# Patient Record
Sex: Female | Born: 1955
Health system: Southern US, Community
[De-identification: ages and names within clinical notes are randomized; demographics above are authoritative.]

## PROBLEM LIST (undated history)

## (undated) DIAGNOSIS — G4730 Sleep apnea, unspecified: Secondary | ICD-10-CM

## (undated) DIAGNOSIS — E039 Hypothyroidism, unspecified: Secondary | ICD-10-CM

## (undated) DIAGNOSIS — L409 Psoriasis, unspecified: Secondary | ICD-10-CM

## (undated) DIAGNOSIS — I10 Essential (primary) hypertension: Secondary | ICD-10-CM

## (undated) DIAGNOSIS — M1990 Unspecified osteoarthritis, unspecified site: Secondary | ICD-10-CM

## (undated) DIAGNOSIS — F32A Depression, unspecified: Secondary | ICD-10-CM

## (undated) HISTORY — PX: DILATION AND CURETTAGE OF UTERUS: SHX78

## (undated) HISTORY — PX: ABDOMINAL HYSTERECTOMY: SHX81

## (undated) HISTORY — DX: Psoriasis, unspecified: L40.9

## (undated) HISTORY — PX: TONSILLECTOMY: SUR1361

## (undated) HISTORY — PX: TUBAL LIGATION: SHX77

## (undated) HISTORY — PX: BELPHAROPTOSIS REPAIR: SHX369

## (undated) HISTORY — PX: ENDOMETRIAL ABLATION: SHX621

---

## 1998-06-24 ENCOUNTER — Other Ambulatory Visit: Admission: RE | Admit: 1998-06-24 | Discharge: 1998-06-24 | Payer: Self-pay | Admitting: Gynecology

## 1999-07-29 ENCOUNTER — Other Ambulatory Visit: Admission: RE | Admit: 1999-07-29 | Discharge: 1999-07-29 | Payer: Self-pay | Admitting: Internal Medicine

## 1999-08-04 ENCOUNTER — Other Ambulatory Visit: Admission: RE | Admit: 1999-08-04 | Discharge: 1999-08-04 | Payer: Self-pay | Admitting: Internal Medicine

## 1999-08-04 ENCOUNTER — Encounter (INDEPENDENT_AMBULATORY_CARE_PROVIDER_SITE_OTHER): Payer: Self-pay

## 1999-09-25 ENCOUNTER — Encounter (INDEPENDENT_AMBULATORY_CARE_PROVIDER_SITE_OTHER): Payer: Self-pay

## 1999-09-25 ENCOUNTER — Ambulatory Visit (HOSPITAL_COMMUNITY): Admission: RE | Admit: 1999-09-25 | Discharge: 1999-09-25 | Payer: Self-pay | Admitting: Gynecology

## 2000-08-25 ENCOUNTER — Other Ambulatory Visit: Admission: RE | Admit: 2000-08-25 | Discharge: 2000-08-25 | Payer: Self-pay | Admitting: Gynecology

## 2000-11-10 ENCOUNTER — Encounter: Payer: Self-pay | Admitting: Family Medicine

## 2000-11-10 ENCOUNTER — Emergency Department (HOSPITAL_COMMUNITY): Admission: RE | Admit: 2000-11-10 | Discharge: 2000-11-10 | Payer: Self-pay | Admitting: Family Medicine

## 2000-11-12 ENCOUNTER — Encounter (INDEPENDENT_AMBULATORY_CARE_PROVIDER_SITE_OTHER): Payer: Self-pay

## 2000-11-12 ENCOUNTER — Inpatient Hospital Stay (HOSPITAL_COMMUNITY): Admission: RE | Admit: 2000-11-12 | Discharge: 2000-11-15 | Payer: Self-pay | Admitting: Gynecology

## 2001-12-22 ENCOUNTER — Other Ambulatory Visit: Admission: RE | Admit: 2001-12-22 | Discharge: 2001-12-22 | Payer: Self-pay | Admitting: Gynecology

## 2003-02-27 ENCOUNTER — Other Ambulatory Visit: Admission: RE | Admit: 2003-02-27 | Discharge: 2003-02-27 | Payer: Self-pay | Admitting: Gynecology

## 2004-02-29 ENCOUNTER — Other Ambulatory Visit: Admission: RE | Admit: 2004-02-29 | Discharge: 2004-02-29 | Payer: Self-pay | Admitting: Gynecology

## 2005-03-02 ENCOUNTER — Other Ambulatory Visit: Admission: RE | Admit: 2005-03-02 | Discharge: 2005-03-02 | Payer: Self-pay | Admitting: Gynecology

## 2006-05-27 ENCOUNTER — Other Ambulatory Visit: Admission: RE | Admit: 2006-05-27 | Discharge: 2006-05-27 | Payer: Self-pay | Admitting: Gynecology

## 2007-08-22 ENCOUNTER — Other Ambulatory Visit: Admission: RE | Admit: 2007-08-22 | Discharge: 2007-08-22 | Payer: Self-pay | Admitting: Gynecology

## 2008-09-07 ENCOUNTER — Other Ambulatory Visit: Admission: RE | Admit: 2008-09-07 | Discharge: 2008-09-07 | Payer: Self-pay | Admitting: Gynecology

## 2008-09-07 ENCOUNTER — Encounter: Payer: Self-pay | Admitting: Women's Health

## 2008-09-07 ENCOUNTER — Ambulatory Visit: Payer: Self-pay | Admitting: Women's Health

## 2009-05-27 ENCOUNTER — Encounter: Admission: RE | Admit: 2009-05-27 | Discharge: 2009-05-27 | Payer: Self-pay | Admitting: Family Medicine

## 2010-06-27 NOTE — Discharge Summary (Signed)
[facility]  Patient:    TILIA, FASO Visit Number: [id number] MRN: [id number]          Service Type: GYN Location: [id number] Attending Physician:  Douglass Rivers Dictated by:   Antony Contras, [facility] Admit Date:  11/12/2000 Discharge Date: 11/15/2000                             Discharge Summary  DISCHARGE DIAGNOSES:          1. Hematometria.                               2. Hematosalpinx.                               3. Secondary pelvic pain.  PROCEDURES:                   Total abdominal hysterectomy.  HISTORY OF PRESENT ILLNESS:   The patient is a 55 year old, gravida 3, para 2, status post endometrial ablation August 2001 who essentially did very well having only one spontaneous period in October of 2001. The patient was also felt to be postmenopausal based on an [facility] and was started on hormone replacement in August in the form of Prempro 2.5 mg. She later developed severe abdominal pain and was treated for diverticulitis by her family physician. She was then later seen by a general surgeon and had a CT of the abdomen and pelvis. The CT of the abdomen was unremarkable. The CT of the pelvis showed low attenuation lesions of the uterus, questionable septated uterus with fluid in the canal consistent with a cornual collection of blood, bilateral hydrosalpinx or hematosalpinx with small amount of free fluid. She was given pain medication and was asked to follow up in the GYN office. She has a history in the past of two cesarean sections and bilateral tubal ligation. She was offered attempted dilation of the lower uterine segment with risk of re-scarring and perforation, or definitive surgery and she decided to proceed with definitive surgery.  HOSPITAL COURSE/TREATMENT:    The patient was admitted on November 12, 2000. Total abdominal hysterectomy was performed by Dr. Farrel Gobble under general anesthesia. Findings included a boggy fundus with  evidence of extravation of blood in the fundal cornual regions as well as the tubes, normal ovaries and cervix.  POSTOPERATIVE COURSE:         The patient remained afebrile, had no difficulty voiding, was able to be discharged in satisfactory condition on her third postoperative day. Postoperative CBC: Hematocrit was 35.1, hemoglobin 12, WBCs 12.4, and platelets 238,000.  DISPOSITION:                  The patient is to follow up in the office in two weeks.  DISCHARGE MEDICATIONS:        Tylox and over-the-counter Motrin for pain.Dictated by:   Antony Contras, [facility] Attending Physician:  Douglass Rivers DD:  12/03/00 TD:  12/06/00 Job: [id number]

## 2010-06-27 NOTE — H&P (Signed)
[facility]  Patient:    Cassidy Terry, Cassidy Terry Visit Number: [id number] MRN: [id number]          Service Type: EMS Location: ED Attending Physician:  Shelba Flake Dictated by:   Douglass Rivers, M.D. Admit Date:  11/10/2000 Discharge Date: 11/10/2000                           History and Physical  CHIEF COMPLAINT:              Pelvic pain with hematometra.  HISTORY OF PRESENT ILLNESS:   The patient is a 55 year old G3, P2 status post an endomyometrial ablation in August 2001 who essentially did very well having only one spontaneous period in October 2001.  The patient was felt to be postmenopausal based on an [facility] and was started on hormone replacement in August 2002 in the form of Prempro 2.5.  She essentially was complaining of tiredness and fatigue, but no hot flashes or vaginal dryness.  The patient had done well until the weekend when she started developing severe abdominal pain. On Monday she was seen by her family practice physician and was felt to have diverticulitis and was treated with antibiotics.  She was seen back in the office later on in the week continuing to have pain and was then sent to [facility] ER to be seen by a Development worker, international aid.  She had a CT of the abdomen and pelvis which was done.  The CT of the abdomen was unremarkable.  The CT of the pelvis showed low attenuation lesions in the uterus, questionable septated uterus with fluid in the canal consistent with cornual collections of blood, bilateral hydrosalpinx or hematosalpinx with a small amount of free fluid. The patient was given pain medicine in the ER and was then asked to follow-up in our office.  She had an ultrasound which confirmed the presence of fluid in the uterine fundal region and questionably polyps as well versus clot.  The adnexa were negative.  PAST OBSTETRICAL HISTORY:     Significant for two cesarean sections, history of bilateral tubal ligation.  PAST  GYNECOLOGICAL HISTORY:   Significant for previously regular period prior to the procedure.  PAST MEDICAL HISTORY:         Negative.  MEDICATIONS:                  Percocet for pain and some undisclosed antibiotic that she has not taken.  PAST SURGICAL HISTORY:        Significant for the endomyometrial ablation in 2001, tubal ligation in 1993.  PHYSICAL EXAMINATION  GENERAL:                      She is an ill appearing female in moderate distress.  She is unable to sit upright.  HEART:                        Regular rate.  LUNGS:                        Clear to auscultation.  ABDOMEN:                      There is some guarding, normal bowel sounds, and some tenderness in the infraumbilical region.  PELVIC:  Bimanual examination:  No cervical motion tenderness.  The anterior fundal portion of the uterus is exquisitely tender. Rectovaginal examination:  There is no tenderness of the lower uterine segment.  ASSESSMENT:                   Hematometra with history of previous endomyometrial ablation.  The patient was offered attempted dilation of the lower uterine segment with risks of rescarring and perforation which were discussed versus going ahead with definitive surgery.  At this point she and her husband would like to just proceed with definitive surgery.  Will therefore have her come in for a hysterectomy in the morning.  Risks and benefits of the procedure were discussed with the patient.  All questions were addressed. Dictated by:   Douglass Rivers, M.D. Attending Physician:  Shelba Flake DD:  11/11/00 TD:  11/11/00 Job: [phone] [id number]

## 2010-06-27 NOTE — H&P (Signed)
[facility]  Patient:    Cassidy Terry, Cassidy Terry Visit Number: [id number] MRN: [id number]          Service Type: EMS Location: ED Attending Physician:  Cassidy Terry Dictated by:   Cassidy Terry, M.D. Admit Date:  11/10/2000 Discharge Date: 11/10/2000   CC:         Cassidy Terry, M.D.  Cassidy Terry, M.D.   History and Physical  ACCOUNT NUMBER:  [id number]  REASON FOR VISIT:  Abdominal pain.  HISTORY OF PRESENT ILLNESS:  [name] is a 55 year old woman who began having some lower abdominal pain approximately a week ago.  She describes its origin as being lower midline, seemingly radiating down into the groin area.  It got worse on Saturday and then has eased off a little bit, but has then been gradually getting worse again.  She originally saw Cassidy Terry who thought she might have an early diverticulitis and placed her on some antibiotics, I believe Bactrim and Flagyl.  This did not seem to help and, in fact, the patient has continued to get worse on the antibiotics.  She has not had any fever or chills at home.  She has not had any urinary tract symptoms.  She does feel like she is constipated and has not had any diarrhea.  She has never had any similar pain to this.  The patient currently describes her pain as feeling like contractions as if she were trying to have a baby.  PAST MEDICAL HISTORY:  Of note she had an endometrial ablation by Cassidy Terry approximately a year ago.  She started on hormone replacement approximately one month ago.  Other operations include two C-sections.  CURRENT MEDICATIONS:  Hypertension medication.  ALLERGIES:  No known drug allergies.  HABITS:  Smokes none.  FAMILY HISTORY:  Unremarkable.  REVIEW OF SYSTEMS:  HEENT negative.  CHEST:  No cough or shortness of breath. HEART:  History of hypertension, otherwise negative.  GU:  Negative.  PHYSICAL EXAMINATION:  VITAL SIGNS:  Blood  pressure 132/45, pulse 85, respirations 20, and temperature 99.  GENERAL:  The patient is a generally healthy, but uncomfortable appearing 55 year old.  She is alert, awake.  HEENT:  Head is normocephalic.  Eyes nonicteric.  Pupils round and regular.  NECK:  Supple without masses or thyromegaly.  LUNGS:  Clear to auscultation.  HEART:  Regular without murmur, rub or gallop.  ABDOMEN:  Soft, somewhat tender diffusely, but particularly in lower midline. The lower adnexal areas are tender also.  Did not repeat pelvic exam; this was seen here.  She got some Stadol prior to coming over here.  LABORATORY:  White count at Cassidy Terry office was 11,000.  X-RAY:  CT was done here and on review there does not appear to be any evidence of acute inflammatory process within the peritoneal cavity.  There is no appendicitis, diverticulitis, dilated bowel loops, bowel obstruction, etc. However, she does appear to have some fluid in her uterus, perhaps some fluid in the cornu of the uterus and possibly consistent with some problem with some residual endometrium following her ablation.  IMPRESSION:  Pelvic pain, most likely GYN in origin.  PLAN:  I discussed the situation with Cassidy Terry, M.D. who felt that her symptoms might be consistent with some residual retained endometrium issue.  He agreed to see her either this evening or tomorrow in the office. Since there is nothing that we will do tonight for her therapeutically other  than some pain medications, I discussed the situation with the patient, and she wished to go home and followup with Cassidy Terry at his office early tomorrow morning.  She was given some morphine here for pain as well as some Percocet tablets to take home.  She will see Cassidy Terry tomorrow morning. Dictated by:   Cassidy Terry, M.D. Attending Physician:  Cassidy Terry DD:  11/10/00 TD:  11/11/00 Job: [id number]

## 2010-06-27 NOTE — Op Note (Signed)
[facility]  Patient:    Cassidy Terry, Cassidy Terry                       MRN: [id number] Proc. Date: 09/25/99 Adm. Date:  [id number] Attending:  Tonye Royalty                           Operative Report  PREOPERATIVE DIAGNOSES:       1. Refractory menometrorrhagia.                               2. Questionable endometrial polyp.  POSTOPERATIVE DIAGNOSIS:      Menometrorrhagia.  OPERATION:                    1. _______ resection.                               2. Endometrial ablation.  SURGEON:                      Juan H. Lily Peer, M.D.  ASSISTANT:  ANESTHESIA:                   General endotracheal anesthesia.  ESTIMATED BLOOD LOSS:         Minimal.  INDICATIONS:                  A 55 year old gravida 3, para 2, AB 1 with history of menometrorrhagia unresponsive to hormonal manipulation, also with previous sterilization procedure.  Evaluation consisted of sonohistogram and endometrial biopsy.  Questionable endometrial polyps. Endometrial biopsy had been benign.  FINDINGS:                     A normal endocervical canal, normal uterine cavity, fleshy-like area perhaps in the fundal region of _______________ cavity. Both tubal ostia were identified and were clear.  The patient had received Lupron 3.75 mg IM approximately 30 days prior to her surgery.  DESCRIPTION OF PROCEDURE:     After the patient was adequately counseled she was taken to the operating room where she underwent a successful general endotracheal anesthesia.  She was placed in the low lithotomy position. Examination under anesthesia demonstrated normal size uterus, anteverted, normal size, shape and consistency.  Adnexa without masses or tenderness.  Of note, the patient had a laminaria that was placed the day before in the office to facilitate insertion of the operative hysteroscope. At the time of surgery this was removed.  The vagina and perineum were prepped and draped in  the usual sterile fashion.  A red rubber Roxan Hockey was utilized to evacuate the bladder of its contents for approximately 75 cc.  After this, the anterior cervical lip was grasped with a single-tooth tenaculum.  A Sims retractor was used and a weighted speculum for exposure.  The [facility] operative resectoscope with a 90 degree wire loop was inserted into the cervical canal and into the uterine cavity.  The distending media was chilled 3% sorbitol solution. Thorough inspection of the endometrial cavity did not demonstrate a true polyp per se.  Perhaps what was seen at sonohistogram may have been sloughing off endometrium or blood clot due to patients history of chronic menometrorrhagia. After the systematic inspection of the intrauterine cavity, with a 90 degree  wire loop the endometrium was incised to the level of the myometrium in a circumferential fashion and this tissues ____________ were submitted for histological evaluation.  Once this was completed, the 90 degree wire loop was switched to the roller ball.  Of note, the [facility] generator was utilized with the 90 degree wire loop; 80 watts was used on the cutting mode and 80 on the coagulation mode. After the roller ball was attached to the operating histoscope, the intrauterine cavity was ablated to a depth of approximately 2 or 3 mm in a circumferential fashion.  Pre- and postoperative pictures were obtained and a copy will be kept at [facility] while a second set will be kept in the patients office record at [facility].  The patient tolerated the procedure well.  She did receive a gram of Cefotan prophylactically.  The single-tooth tenaculum was removed and there was no bleeding noted.  The patient was extubated and transferred to the recovery room with stable vital signs.  FLUID RESUSCITATION:          700 cc of lactated Ringers.  Fluid deficit from the 3% sorbitol was registered at 100 cc. DD:   09/25/99 TD:  09/26/99 Job: [id number]

## 2010-10-11 HISTORY — PX: FOOT SURGERY: SHX648

## 2010-10-24 ENCOUNTER — Encounter: Payer: Self-pay | Admitting: Women's Health

## 2010-11-06 ENCOUNTER — Ambulatory Visit (INDEPENDENT_AMBULATORY_CARE_PROVIDER_SITE_OTHER): Payer: 59 | Admitting: Women's Health

## 2010-11-06 ENCOUNTER — Other Ambulatory Visit (HOSPITAL_COMMUNITY)
Admission: RE | Admit: 2010-11-06 | Discharge: 2010-11-06 | Disposition: A | Discharge status: Home / Self Care | Payer: 59 | Source: Ambulatory Visit | Attending: Women's Health | Admitting: Women's Health

## 2010-11-06 ENCOUNTER — Encounter: Payer: Self-pay | Admitting: Women's Health

## 2010-11-06 VITALS — BP 140/80 | Ht 68.0 in | Wt 163.0 lb

## 2010-11-06 DIAGNOSIS — Z01419 Encounter for gynecological examination (general) (routine) without abnormal findings: Secondary | ICD-10-CM

## 2010-11-06 NOTE — Progress Notes (Signed)
Cassidy Terry [birth year] [id number]    History:    The patient presents for annual exam.  Works in [employer] and recent right foot surgery.   Past medical history, past surgical history, family history and social history were all reviewed and documented in the EPIC chart.   ROS:  A  ROS was performed and pertinent positives and negatives are included in the history.  Exam:  Filed Vitals:   11/06/10 1132  BP: 140/80    General appearance:  Normal Head/Neck:  Normal, without cervical or supraclavicular adenopathy. Thyroid:  Symmetrical, normal in size, without palpable masses or nodularity. Respiratory  Effort:  Normal  Auscultation:  Clear without wheezing or rhonchi Cardiovascular  Auscultation:  Regular rate, without rubs, murmurs or gallops  Edema/varicosities:  Not grossly evident Abdominal  Soft,nontender, without masses, guarding or rebound.  Liver/spleen:  No organomegaly noted  Hernia:  None appreciated  Skin  Inspection:  Grossly normal  Palpation:  Grossly normal Neurologic/psychiatric  Orientation:  Normal with appropriate conversation.  Mood/affect:  Normal  Genitourinary    Breasts: Examined lying and sitting.     Right: Without masses, retractions, discharge or axillary adenopathy.     Left: Without masses, retractions, discharge or axillary adenopathy.   Inguinal/mons:  Normal without inguinal adenopathy  External genitalia:  Normal  BUS/Urethra/Skene's glands:  Normal  Bladder:  Normal  Vagina:  Normal  Cervix:  absent  Uterus:  absent  Adnexa/parametria:     Rt: Without masses or tenderness.   Lt: Without masses or tenderness.  Anus and perineum: Normal  Digital rectal exam: Normal sphincter tone without palpated masses or tenderness  Assessment/Plan:  55 y.o. MWF G$P2 for annual exam. History of a TAH for hematometria on no ERT. She had a negative colonoscopy in 08, she states she has had a DEXA in the past at [facility] not sure when will repeat  this year. Prescription given, will schedule. Had recent for surgery making exercise difficult but will gradually increase. SBEs, annual mammogram which have been normal, calcium rich diet encourage, vitamin D 1000 daily encouraged, fish oil supplement, Pap only today. Labs are at her primary care. Flu vaccine encouraged.[name]    Harrington Challenger WHNP, 12:09 PM 11/06/2010

## 2010-12-18 ENCOUNTER — Encounter: Payer: Self-pay | Admitting: Women's Health

## 2011-08-19 ENCOUNTER — Ambulatory Visit
Admission: RE | Admit: 2011-08-19 | Discharge: 2011-08-19 | Disposition: A | Discharge status: Home / Self Care | Payer: 59 | Source: Ambulatory Visit | Attending: Family Medicine | Admitting: Family Medicine

## 2011-08-19 ENCOUNTER — Other Ambulatory Visit: Payer: Self-pay | Admitting: Family Medicine

## 2011-08-19 DIAGNOSIS — R079 Chest pain, unspecified: Secondary | ICD-10-CM

## 2011-12-03 ENCOUNTER — Telehealth: Payer: Self-pay | Admitting: Women's Health

## 2011-12-03 NOTE — Telephone Encounter (Signed)
Telephone call to review labs that were faxed. Has followup with primary care today for evaluation of elevated cholesterol. Had been on cholesterol medication and has stopped. For a repeat LP today and has annual exam here scheduled in  November.

## 2011-12-17 ENCOUNTER — Encounter: Payer: Self-pay | Admitting: Women's Health

## 2011-12-24 ENCOUNTER — Encounter: Payer: Self-pay | Admitting: Women's Health

## 2011-12-24 ENCOUNTER — Ambulatory Visit (INDEPENDENT_AMBULATORY_CARE_PROVIDER_SITE_OTHER): Payer: 59 | Admitting: Women's Health

## 2011-12-24 VITALS — BP 108/70 | Ht 67.5 in | Wt 166.0 lb

## 2011-12-24 DIAGNOSIS — Z01419 Encounter for gynecological examination (general) (routine) without abnormal findings: Secondary | ICD-10-CM

## 2011-12-24 NOTE — Progress Notes (Signed)
Cassidy Terry 1955-06-27 [id number]    History:    The patient presents for annual exam.  Postmenopausal no ERT/ history of TAH for hematometria 2002. History of normal Paps and mammograms. History of elevated cholesterol had been on cholesterol medication/normal lipid panel at primary care. Normal colonoscopy in 2008, negative Hemoccult at primary care. DEXA normal 2012, T score -0.8, FRAX 5.6%.   Past medical history, past surgical history, family history and social history were all reviewed and documented in the EPIC chart. 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 Amber living in [place], husband Hotel manager, son lives here.   ROS:  A  ROS was performed and pertinent positives and negatives are included in the history.  Exam:  Filed Vitals:   12/24/11 0845  BP: 108/70    General appearance:  Normal Head/Neck:  Normal, without cervical or supraclavicular adenopathy. Thyroid:  Symmetrical, normal in size, without palpable masses or nodularity. Respiratory  Effort:  Normal  Auscultation:  Clear without wheezing or rhonchi Cardiovascular  Auscultation:  Regular rate, without rubs, murmurs or gallops  Edema/varicosities:  Not grossly evident Abdominal  Soft,nontender, without masses, guarding or rebound.  Liver/spleen:  No organomegaly noted  Hernia:  None appreciated  Skin  Inspection:  Grossly normal  Palpation:  Grossly normal Neurologic/psychiatric  Orientation:  Normal with appropriate conversation.  Mood/affect:  Normal  Genitourinary    Breasts: Examined lying and sitting.     Right: Without masses, retractions, discharge or axillary adenopathy.     Left: Without masses, retractions, discharge or axillary adenopathy.   Inguinal/mons:  Normal without inguinal adenopathy  External genitalia:  Normal  BUS/Urethra/Skene's glands:  Normal  Bladder:  Normal  Vagina:  Normal  Cervix:  Absent  Uterus:  Absent  Adnexa/parametria:     Rt: Without masses or tenderness.   Lt: Without  masses or tenderness.  Anus and perineum: Normal  Digital rectal exam: Normal sphincter tone without palpated masses or tenderness  Assessment/Plan:  56 y.o. M. WF G3 P2 for annual exam with occasional urinary frequency.    Urinary frequency Postmenopausal/TAH/ no ERT History of hypercholesteremia currently on no medication Normal DEXA 2012  Plan: Continue labs at primary care. UA, new screening Pap guidelines reviewed normal Pap 2012. SBE's, continue annual mammogram, calcium rich diet, vitamin D 2000 daily encouraged, reviewed importance of increasing regular exercise for bone and general health.    Harrington Challenger [facility], 9:39 AM 12/24/2011

## 2011-12-24 NOTE — Patient Instructions (Addendum)
Vit D 2000 daily Health Recommendations for Postmenopausal Women Based on the Results of the Women's Health Initiative (WHI) and Other Studies The WHI is a major 15-year research program to address the most common causes of death, disability and poor quality of life in postmenopausal women. Some of these causes are heart disease, cancer, bone loss (osteoporosis) and others. Taking into account all of the findings from WHI and other studies, here are bottom-line health recommendations for women: CARDIOVASCULAR DISEASE Heart Disease: A heart attack is a medical emergency. Know the signs and symptoms of a heart attack. Hormone therapy should not be used to prevent heart disease. In women with heart disease, hormone therapy should not be used to prevent further disease. Hormone therapy increases the risk of blood clots. Below are things women can do to reduce their risk for heart disease.   Do not smoke. If you smoke, quit. Women who smoke are 2 to 6 times more likely to suffer a heart attack than non-smoking women.  Aim for a healthy weight. Being overweight causes many preventable deaths. Eat a healthy and balanced diet and drink an adequate amount of liquids.  Get moving. Make a commitment to be more physically active. Aim for 30 minutes of activity on most, if not all days of the week.  Eat for heart health. Choose a diet that is low in saturated fat, trans fat, and cholesterol. Include whole grains, vegetables, and fruits. Read the labels on the food container before buying it.  Know your numbers. Ask your caregiver to check your blood pressure, cholesterol (total, HDL, LDL, triglycerides) and blood glucose. Work with your caregiver to improve any numbers that are not normal.  High blood pressure. Limit or stop your table salt intake (try salt substitute and food seasonings), avoid salty foods and drinks. Read the labels on the food container before buying it. Avoid becoming overweight by eating  well and exercising. STROKE  Stroke is a medical emergency. Stroke can be the result of a blood clot in the blood vessel in the brain or by a brain hemorrhage (bleeding). Know the signs and symptoms of a stroke. To lower the risk of developing a stroke:  Avoid fatty foods.  Quit smoking.  Control your diabetes, blood pressure, and irregular heart rate. THROMBOPHLIBITIS (BLOOD CLOT) OF THE LEG  Hormone treatment is a big cause of developing blood clots in the leg. Becoming overweight and leading a stationary lifestyle also may contribute to developing blood clots. Controlling your diet and exercising will help lower the risk of developing blood clots. CANCER SCREENING  Breast Cancer: Women should take steps to reduce their risk of breast cancer. This includes having regular mammograms, monthly self breast exams and regular breast exams by your caregiver. Have a mammogram every one to two years if you are 40 to 56 years old. Have a mammogram annually if you are 50 years old or older depending on your risk factors. Women who are high risk for breast cancer may need more frequent mammograms. There are tests available (testing the genes in your body) if you have family history of breast cancer called BRCA 1 and 2. These tests can help determine the risks of developing breast cancer.  Intestinal or Stomach Cancer: Women should talk to their caregiver about when to start screening, what tests and how often they should be done, and the benefits and risks of doing these tests. Tests to consider are a rectal exam, fecal occult blood, sigmoidoscopy, colononoscoby, barium enema   and upper GI series of the stomach. Depending on the age, you may want to get a medical and family history of colon cancer. Women who are high risk may need to be screened at an earlier age and more often.  Cervical Cancer: A Pap test of the cervix should be done every year and every 3 years when there has been three straight years of a  normal Pap test. Women with an abnormal Pap test should be screened more often or have a cervical biopsy depending on your caregiver's recommendation.  Uterine Cancer: If you have vaginal bleeding after you are in the menopause, it should be evaluated by your caregiver.  Ovarian cancer: There are no reliable tests available to screen for ovarian cancer at this time except for yearly pelvic exams.  Lung Cancer: Yearly chest X-rays can detect lung cancer and should be done on high risk women, such as cigarette smokers and women with chronic lung disease (emphysemia).  Skin Cancer: A complete body skin exam should be done at your yearly examination. Avoid overexposure to the sun and ultraviolet light lamps. Use a strong sun block cream when in the sun. All of these things are important in lowering the risk of skin cancer. MENOPAUSE Menopause Symptoms: Hormone therapy products are effective for treating symptoms associated with menopause:  Moderate to severe hot flashes.  Night sweats.  Mood swings.  Headaches.  Tiredness.  Loss of sex drive.  Insomnia.  Other symptoms. However, hormone therapy products carry serious risks, especially in older women. Women who use or are thinking about using estrogen or estrogen with progestin treatments should discuss that with their caregiver. Your caregiver will know if the benefits outweigh the risks. The Food and Drug Administration (FDA) has concluded that hormone therapy should be used only at the lowest doses and for the shortest amount of time to reach treatment goals. It is not known at what doses there may be less risk of serious side effects. There are other treatments such as herbal medication (not controlled or regulated by the FDA), group therapy, counseling and acupuncture that may be helpful. OSTEOPOROSIS Protecting Against Bone Loss and Preventing Fracture: If hormone therapy is used for prevention of bone loss (osteoporosis), the risks for  bone loss must outweigh the risk of the therapy. Women considering taking hormone therapy for bone loss should ask their health care providers about other medications (fosamax and boniva) that are considered safe and effective for preventing bone loss and bone fractures. To guard against bone loss or fractures, it is recommended that women should take at least 1000-1500 mg of calcium and 400-800 IU of vitamin D daily in divided doses. Smoking and excessive alcohol intake increases the risk of osteoporosis. Eat foods rich in calcium and vitamin D and do weight bearing exercises several times a week as your caregiver suggests. DIABETES Diabetes Melitus: Women with Type I or Type 2 diabetes should keep their diabetes in control with diet, exercise and medication. Avoid too many sweets, starchy and fatty foods. Being overweight can affect your diabetes. COGNITION AND MEMORY Cognition and Memory: Menopausal hormone therapy is not recommended for the prevention of cognitive disorders such as Alzheimer's disease or memory loss. WHI found that women treated with hormone therapy have a greater risk of developing dementia.  DEPRESSION  Depression may occur at any age, but is common in elderly women. The reasons may be because of physical, medical, social (loneliness), financial and/or economic problems and needs. Becoming involved with church,   volunteer or social groups, seeking treatment for any physical or medical problems is recommended. Also, look into getting professional advice for any economic or financial problems. ACCIDENTS  Accidents are common and can be serious in the elderly woman. Prepare your house to prevent accidents. Eliminate throw rugs, use hip protectors, place hand bars in the bath, shower and toilet areas. Avoid wearing high heel shoes and walking on wet, snowy and icy areas. Stop driving if you have vision, hearing problems or are unsteady with you movements and reflexes. RHEUMATOID  ARTHRITIS Rheumatoid arthritis causes pain, swelling and stiffness of your bone joints. It can limit many of your activities. Over-the-counter medications may help, but prescription medications may be necessary. Talk with your caregiver about this. Exercise (walking, water aerobics), good posture, using splints on painful joints, warm baths or applying warm compresses to stiff joints and cold compresses to painful joints may be helpful. Smoking and excessive drinking may worsen the symptoms of arthritis. Seek help from a physical therapist if the arthritis is becoming a problem with your daily activities. IMMUNIZATIONS  Several immunizations are important to have during your senior years, including:   Tetanus and a diptheria shot booster every 10 years.  Influenza every year before the flu season begins.  Pneumonia vaccine.  Shingles vaccine.  Others as indicated (example: H1N1 vaccine). Document Released: 03/20/2005 Document Revised: 04/20/2011 Document Reviewed: 11/14/2007 ExitCare Patient Information 2013 [employer].  

## 2011-12-25 LAB — URINALYSIS W MICROSCOPIC + REFLEX CULTURE
Bilirubin Urine: NEGATIVE
Glucose, UA: NEGATIVE mg/dL
Hgb urine dipstick: NEGATIVE
Ketones, ur: NEGATIVE mg/dL
Protein, ur: NEGATIVE mg/dL

## 2012-11-04 ENCOUNTER — Other Ambulatory Visit: Payer: Self-pay | Admitting: Dermatology

## 2012-12-21 ENCOUNTER — Encounter: Payer: Self-pay | Admitting: Women's Health

## 2012-12-26 ENCOUNTER — Encounter: Payer: Self-pay | Admitting: Women's Health

## 2012-12-26 ENCOUNTER — Encounter: Payer: 59 | Admitting: Women's Health

## 2012-12-27 ENCOUNTER — Encounter: Payer: Self-pay | Admitting: Women's Health

## 2012-12-27 ENCOUNTER — Ambulatory Visit (INDEPENDENT_AMBULATORY_CARE_PROVIDER_SITE_OTHER): Payer: 59 | Admitting: Women's Health

## 2012-12-27 VITALS — BP 140/90 | Ht 67.0 in | Wt 166.0 lb

## 2012-12-27 DIAGNOSIS — Z23 Encounter for immunization: Secondary | ICD-10-CM

## 2012-12-27 DIAGNOSIS — L408 Other psoriasis: Secondary | ICD-10-CM

## 2012-12-27 DIAGNOSIS — L409 Psoriasis, unspecified: Secondary | ICD-10-CM | POA: Insufficient documentation

## 2012-12-27 DIAGNOSIS — Z124 Encounter for screening for malignant neoplasm of cervix: Secondary | ICD-10-CM

## 2012-12-27 NOTE — Progress Notes (Signed)
[place] Terry September 04, 1955 [id number]    History:    The patient presents for annual exam.  Postmenopausal no HRT. 2002 TAH for hematometria after ablation. Normal Pap and mammogram history. Normal colonoscopy 2008. 2014 DEXA T score -0.7 left femoral neck, FRAX 6%/0.2%. Reports normal labs at primary care.  Past medical history, past surgical history, family history and social history were all reviewed and documented in the EPIC chart. Works in Insurance account manager at [employer].  [place] lives in [place], husband in [place]. Son on disability, helps care for 17-year-old granddaughter. Father hypertension.   ROS:  A  ROS was performed and pertinent positives and negatives are included in the history.  Exam:  Filed Vitals:   12/27/12 1611  BP: 140/90    General appearance:  Normal Head/Neck:  Normal, without cervical or supraclavicular adenopathy. Thyroid:  Symmetrical, normal in size, without palpable masses or nodularity. Respiratory  Effort:  Normal  Auscultation:  Clear without wheezing or rhonchi Cardiovascular  Auscultation:  Regular rate, without rubs, murmurs or gallops  Edema/varicosities:  Not grossly evident Abdominal  Soft,nontender, without masses, guarding or rebound.  Liver/spleen:  No organomegaly noted  Hernia:  None appreciated  Skin  Inspection:  Grossly normal  Palpation:  Grossly normal Neurologic/psychiatric  Orientation:  Normal with appropriate conversation.  Mood/affect:  Normal  Genitourinary    Breasts: Examined lying and sitting.     Right: Without masses, retractions, discharge or axillary adenopathy.     Left: Without masses, retractions, discharge or axillary adenopathy.   Inguinal/mons:  Normal without inguinal adenopathy  External genitalia:  Normal  BUS/Urethra/Skene's glands:  Normal  Bladder:  Normal  Vagina:  Normal  Cervix: Absent  Uterus:  Absent Adnexa/parametria:     Rt: Without masses or tenderness.   Lt: Without masses or  tenderness.  Anus and perineum: Normal  Digital rectal exam: Normal sphincter tone without palpated masses or tenderness  Assessment/Plan:  57 y.o.MWF G2P2  for annual exam with no complaints.  TAH no HRT Psoriasis Blood pressure elevated today  Plan: Reviewed importance of monitoring blood pressure, if continues greater than 130/80 to followup with primary care. SBE's, continue annual mammogram, calcium rich diet, vitamin D 2000 daily encouraged. Home safety and fall prevention, importance of regular exercise reviewed. Labs at primary care.     [place] [facility], 5:29 PM 12/27/2012

## 2012-12-28 NOTE — Addendum Note (Signed)
Addended by: Richardson Chiquito on: 12/28/2012 09:47 AM   Modules accepted: Orders

## 2013-12-11 ENCOUNTER — Encounter: Payer: Self-pay | Admitting: Women's Health

## 2013-12-28 ENCOUNTER — Encounter: Payer: Self-pay | Admitting: Women's Health

## 2013-12-28 ENCOUNTER — Ambulatory Visit (INDEPENDENT_AMBULATORY_CARE_PROVIDER_SITE_OTHER): Payer: 59 | Admitting: Women's Health

## 2013-12-28 VITALS — BP 140/80 | Ht 67.0 in | Wt 164.0 lb

## 2013-12-28 DIAGNOSIS — Z01419 Encounter for gynecological examination (general) (routine) without abnormal findings: Secondary | ICD-10-CM

## 2013-12-28 NOTE — Patient Instructions (Signed)
Health Recommendations for Postmenopausal Women Respected and ongoing research has looked at the most common causes of death, disability, and poor quality of life in postmenopausal women. The causes include heart disease, diseases of blood vessels, diabetes, depression, cancer, and bone loss (osteoporosis). Many things can be done to help lower the chances of developing these and other common problems. CARDIOVASCULAR DISEASE Heart Disease: A heart attack is a medical emergency. Know the signs and symptoms of a heart attack. Below are things women can do to reduce their risk for heart disease.   Do not smoke. If you smoke, quit.  Aim for a healthy weight. Being overweight causes many preventable deaths. Eat a healthy and balanced diet and drink an adequate amount of liquids.  Get moving. Make a commitment to be more physically active. Aim for 30 minutes of activity on most, if not all days of the week.  Eat for heart health. Choose a diet that is low in saturated fat and cholesterol and eliminate trans fat. Include whole grains, vegetables, and fruits. Read and understand the labels on food containers before buying.  Know your numbers. Ask your caregiver to check your blood pressure, cholesterol (total, HDL, LDL, triglycerides) and blood glucose. Work with your caregiver on improving your entire clinical picture.  High blood pressure. Limit or stop your table salt intake (try salt substitute and food seasonings). Avoid salty foods and drinks. Read labels on food containers before buying. Eating well and exercising can help control high blood pressure. STROKE  Stroke is a medical emergency. Stroke may be the result of a blood clot in a blood vessel in the brain or by a brain hemorrhage (bleeding). Know the signs and symptoms of a stroke. To lower the risk of developing a stroke:  Avoid fatty foods.  Quit smoking.  Control your diabetes, blood pressure, and irregular heart rate. THROMBOPHLEBITIS  (BLOOD CLOT) OF THE LEG  Becoming overweight and leading a stationary lifestyle may also contribute to developing blood clots. Controlling your diet and exercising will help lower the risk of developing blood clots. CANCER SCREENING  Breast Cancer: Take steps to reduce your risk of breast cancer.  You should practice "breast self-awareness." This means understanding the normal appearance and feel of your breasts and should include breast self-examination. Any changes detected, no matter how small, should be reported to your caregiver.  After age 40, you should have a clinical breast exam (CBE) every year.  Starting at age 40, you should consider having a mammogram (breast X-ray) every year.  If you have a family history of breast cancer, talk to your caregiver about genetic screening.  If you are at high risk for breast cancer, talk to your caregiver about having an MRI and a mammogram every year.  Intestinal or Stomach Cancer: Tests to consider are a rectal exam, fecal occult blood, sigmoidoscopy, and colonoscopy. Women who are high risk may need to be screened at an earlier age and more often.  Cervical Cancer:  Beginning at age 30, you should have a Pap test every 3 years as long as the past 3 Pap tests have been normal.  If you have had past treatment for cervical cancer or a condition that could lead to cancer, you need Pap tests and screening for cancer for at least 20 years after your treatment.  If you had a hysterectomy for a problem that was not cancer or a condition that could lead to cancer, then you no longer need Pap tests.    If you are between ages 65 and 70, and you have had normal Pap tests going back 10 years, you no longer need Pap tests.  If Pap tests have been discontinued, risk factors (such as a new sexual partner) need to be reassessed to determine if screening should be resumed.  Some medical problems can increase the chance of getting cervical cancer. In these  cases, your caregiver may recommend more frequent screening and Pap tests.  Uterine Cancer: If you have vaginal bleeding after reaching menopause, you should notify your caregiver.  Ovarian Cancer: Other than yearly pelvic exams, there are no reliable tests available to screen for ovarian cancer at this time except for yearly pelvic exams.  Lung Cancer: Yearly chest X-rays can detect lung cancer and should be done on high risk women, such as cigarette smokers and women with chronic lung disease (emphysema).  Skin Cancer: A complete body skin exam should be done at your yearly examination. Avoid overexposure to the sun and ultraviolet light lamps. Use a strong sun block cream when in the sun. All of these things are important for lowering the risk of skin cancer. MENOPAUSE Menopause Symptoms: Hormone therapy products are effective for treating symptoms associated with menopause:  Moderate to severe hot flashes.  Night sweats.  Mood swings.  Headaches.  Tiredness.  Loss of sex drive.  Insomnia.  Other symptoms. Hormone replacement carries certain risks, especially in older women. Women who use or are thinking about using estrogen or estrogen with progestin treatments should discuss that with their caregiver. Your caregiver will help you understand the benefits and risks. The ideal dose of hormone replacement therapy is not known. The Food and Drug Administration (FDA) has concluded that hormone therapy should be used only at the lowest doses and for the shortest amount of time to reach treatment goals.  OSTEOPOROSIS Protecting Against Bone Loss and Preventing Fracture If you use hormone therapy for prevention of bone loss (osteoporosis), the risks for bone loss must outweigh the risk of the therapy. Ask your caregiver about other medications known to be safe and effective for preventing bone loss and fractures. To guard against bone loss or fractures, the following is recommended:  If  you are younger than age 50, take 1000 mg of calcium and at least 600 mg of Vitamin D per day.  If you are older than age 50 but younger than age 70, take 1200 mg of calcium and at least 600 mg of Vitamin D per day.  If you are older than age 70, take 1200 mg of calcium and at least 800 mg of Vitamin D per day. Smoking and excessive alcohol intake increases the risk of osteoporosis. Eat foods rich in calcium and vitamin D and do weight bearing exercises several times a week as your caregiver suggests. DIABETES Diabetes Mellitus: If you have type I or type 2 diabetes, you should keep your blood sugar under control with diet, exercise, and recommended medication. Avoid starchy and fatty foods, and too many sweets. Being overweight can make diabetes control more difficult. COGNITION AND MEMORY Cognition and Memory: Menopausal hormone therapy is not recommended for the prevention of cognitive disorders such as Alzheimer's disease or memory loss.  DEPRESSION  Depression may occur at any age, but it is common in elderly women. This may be because of physical, medical, social (loneliness), or financial problems and needs. If you are experiencing depression because of medical problems and control of symptoms, talk to your caregiver about this. Physical   activity and exercise may help with mood and sleep. Community and volunteer involvement may improve your sense of value and worth. If you have depression and you feel that the problem is getting worse or becoming severe, talk to your caregiver about which treatment options are best for you. ACCIDENTS  Accidents are common and can be serious in elderly woman. Prepare your house to prevent accidents. Eliminate throw rugs, place hand bars in bath, shower, and toilet areas. Avoid wearing high heeled shoes or walking on wet, snowy, and icy areas. Limit or stop driving if you have vision or hearing problems, or if you feel you are unsteady with your movements and  reflexes. HEPATITIS C Hepatitis C is a type of viral infection affecting the liver. It is spread mainly through contact with blood from an infected person. It can be treated, but if left untreated, it can lead to severe liver damage over the years. Many people who are infected do not know that the virus is in their blood. If you are a "baby-boomer", it is recommended that you have one screening test for Hepatitis C. IMMUNIZATIONS  Several immunizations are important to consider having during your senior years, including:   Tetanus, diphtheria, and pertussis booster shot.  Influenza every year before the flu season begins.  Pneumonia vaccine.  Shingles vaccine.  Others, as indicated based on your specific needs. Talk to your caregiver about these. Document Released: 03/20/2005 Document Revised: 06/12/2013 Document Reviewed: 11/14/2007 ExitCare Patient Information 2015 [employer]. This information is not intended to replace advice given to you by your health care provider. Make sure you discuss any questions you have with your health care provider.  

## 2013-12-28 NOTE — Progress Notes (Signed)
[place] Terry [birth year] [id number]    History:    Presents for annual exam.  2002 TAH for hematometra  after ablation. Normal Pap and mammogram history. Psoriasis. 2014 DEXA -0.7 FRAX 6%/0.2%. Labs primary care. 2008 negative colonoscopy.  Past medical history, past surgical history, family history and social history were all reviewed and documented in the EPIC chart. 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 Son has full custody of 71 year old granddaughter. Father hypertension.  ROS:  A  12 point ROS was performed and pertinent positives and negatives are included.  Exam:  Filed Vitals:   12/28/13 1545  BP: 140/80    General appearance:  Normal Thyroid:  Symmetrical, normal in size, without palpable masses or nodularity. Respiratory  Auscultation:  Clear without wheezing or rhonchi Cardiovascular  Auscultation:  Regular rate, without rubs, murmurs or gallops  Edema/varicosities:  Not grossly evident Abdominal  Soft,nontender, without masses, guarding or rebound.  Liver/spleen:  No organomegaly noted  Hernia:  None appreciated  Skin  Inspection:  Grossly normal   Breasts: Examined lying and sitting.     Right: Without masses, retractions, discharge or axillary adenopathy.     Left: Without masses, retractions, discharge or axillary adenopathy. Gentitourinary   Inguinal/mons:  Normal without inguinal adenopathy  External genitalia:  Normal  BUS/Urethra/Skene's glands:  Normal  Vagina:  Normal  Cervix:  Absent Uterus: Absent  Adnexa/parametria:     Rt: Without masses or tenderness.   Lt: Without masses or tenderness.  Anus and perineum: Normal  Digital rectal exam: Normal sphincter tone without palpated masses or tenderness  Assessment/Plan:  58 y.o. MWF G2P2 for annual exam with no complaints.  2002 TAH for hematometra Psoriasis-dermatologist Labs primary care  Plan: SBE's, continue annual mammogram, calcium rich diet, vitamin D  per primary care. Continue regular exercise, active lifestyle. UA. We'll check with primary care if has had a T dap in the last 10 years. Blood pressure slightly elevated will have checked at primary care at scheduled follow-up in 2 weeks.  Huel Cote [facility], 4:33 PM 12/28/2013

## 2013-12-29 LAB — URINALYSIS W MICROSCOPIC + REFLEX CULTURE
BILIRUBIN URINE: NEGATIVE
CASTS: NONE SEEN
CRYSTALS: NONE SEEN
Glucose, UA: NEGATIVE mg/dL
HGB URINE DIPSTICK: NEGATIVE
Ketones, ur: NEGATIVE mg/dL
Nitrite: NEGATIVE
PH: 6.5 (ref 5.0–8.0)
Protein, ur: NEGATIVE mg/dL
SPECIFIC GRAVITY, URINE: 1.007 (ref 1.005–1.030)
Squamous Epithelial / LPF: NONE SEEN
Urobilinogen, UA: 0.2 mg/dL (ref 0.0–1.0)

## 2013-12-31 LAB — URINE CULTURE

## 2014-01-01 ENCOUNTER — Other Ambulatory Visit: Payer: Self-pay | Admitting: Women's Health

## 2014-01-01 MED ORDER — NITROFURANTOIN MONOHYD MACRO 100 MG PO CAPS: 100.0000 mg | ORAL_CAPSULE | Freq: Two times a day (BID) | ORAL | Status: DC

## 2014-05-21 ENCOUNTER — Other Ambulatory Visit: Payer: Self-pay | Admitting: Dermatology

## 2016-04-13 DIAGNOSIS — H04123 Dry eye syndrome of bilateral lacrimal glands: Secondary | ICD-10-CM | POA: Diagnosis not present

## 2016-04-13 DIAGNOSIS — H25813 Combined forms of age-related cataract, bilateral: Secondary | ICD-10-CM | POA: Diagnosis not present

## 2016-06-04 DIAGNOSIS — E785 Hyperlipidemia, unspecified: Secondary | ICD-10-CM | POA: Diagnosis not present

## 2016-06-15 DIAGNOSIS — W57XXXA Bitten or stung by nonvenomous insect and other nonvenomous arthropods, initial encounter: Secondary | ICD-10-CM | POA: Diagnosis not present

## 2016-06-15 DIAGNOSIS — L237 Allergic contact dermatitis due to plants, except food: Secondary | ICD-10-CM | POA: Diagnosis not present

## 2016-06-24 ENCOUNTER — Encounter: Payer: Self-pay | Admitting: Gynecology

## 2016-08-14 DIAGNOSIS — E559 Vitamin D deficiency, unspecified: Secondary | ICD-10-CM | POA: Diagnosis not present

## 2016-11-19 DIAGNOSIS — L821 Other seborrheic keratosis: Secondary | ICD-10-CM | POA: Diagnosis not present

## 2016-11-19 DIAGNOSIS — Z85828 Personal history of other malignant neoplasm of skin: Secondary | ICD-10-CM | POA: Diagnosis not present

## 2016-11-19 DIAGNOSIS — D485 Neoplasm of uncertain behavior of skin: Secondary | ICD-10-CM | POA: Diagnosis not present

## 2016-11-19 DIAGNOSIS — D225 Melanocytic nevi of trunk: Secondary | ICD-10-CM | POA: Diagnosis not present

## 2016-11-19 DIAGNOSIS — D235 Other benign neoplasm of skin of trunk: Secondary | ICD-10-CM | POA: Diagnosis not present

## 2016-12-25 DIAGNOSIS — Z0000 Encounter for general adult medical examination without abnormal findings: Secondary | ICD-10-CM | POA: Diagnosis not present

## 2016-12-25 DIAGNOSIS — E785 Hyperlipidemia, unspecified: Secondary | ICD-10-CM | POA: Diagnosis not present

## 2016-12-25 DIAGNOSIS — Z23 Encounter for immunization: Secondary | ICD-10-CM | POA: Diagnosis not present

## 2016-12-25 DIAGNOSIS — E559 Vitamin D deficiency, unspecified: Secondary | ICD-10-CM | POA: Diagnosis not present

## 2016-12-25 DIAGNOSIS — Z79899 Other long term (current) drug therapy: Secondary | ICD-10-CM | POA: Diagnosis not present

## 2017-01-12 ENCOUNTER — Ambulatory Visit: Payer: 59 | Admitting: Women's Health

## 2017-01-12 ENCOUNTER — Encounter: Payer: Self-pay | Admitting: Women's Health

## 2017-01-12 VITALS — BP 136/80 | Ht 67.0 in | Wt 175.0 lb

## 2017-01-12 DIAGNOSIS — Z01419 Encounter for gynecological examination (general) (routine) without abnormal findings: Secondary | ICD-10-CM

## 2017-01-12 NOTE — Progress Notes (Signed)
Cassidy Terry [birth year] [id number]    History:    Presents for annual exam.  2002 TAH for hematometra area after an ablation on no HRT. Normal Pap and mammogram history. 2014 normal DEXA. 2008 negative colonoscopy scheduled repeat screening this month. Mother recently diagnosed with vaginal cancer from HPV currently completing radiation therapy.  Past medical history, past surgical history, family history and social history were all reviewed and documented in the EPIC chart. Works at [employer]. Daughter living in [place], son local. Father hypertension.  ROS:  A ROS was performed and pertinent positives and negatives are included.  Exam:  Vitals:   01/12/17 0907  BP: 136/80  Weight: 175 lb (79.4 kg)  Height: 5\' 7"  (1.702 m)   Body mass index is 27.41 kg/m.   General appearance:  Normal Thyroid:  Symmetrical, normal in size, without palpable masses or nodularity. Respiratory  Auscultation:  Clear without wheezing or rhonchi Cardiovascular  Auscultation:  Regular rate, without rubs, murmurs or gallops  Edema/varicosities:  Not grossly evident Abdominal  Soft,nontender, without masses, guarding or rebound.  Liver/spleen:  No organomegaly noted  Hernia:  None appreciated  Skin  Inspection:  Grossly normal   Breasts: Examined lying and sitting.     Right: Without masses, retractions, discharge or axillary adenopathy.     Left: Without masses, retractions, discharge or axillary adenopathy. Gentitourinary   Inguinal/mons:  Normal without inguinal adenopathy  External genitalia:  Normal  BUS/Urethra/Skene's glands:  Normal  Vagina:  Normal  Cervix:  and uterus absent Adnexa/parametria:     Rt: Without masses or tenderness.   Lt: Without masses or tenderness.  Anus and perineum: Normal  Digital rectal exam: Normal sphincter tone without palpated masses or tenderness  Assessment/Plan:  61 y.o. M WF G4 P2 for annual exam with no complaints.  2002 TAH on no  HRT Hypertension, anxiety, hypercholesterolemia-Labs and meds at primary care  Plan: Pap with HR HPV typing per request, reviewed vaginal cancer most likely not genetic. SBE's, continue annual screening mammogram overdue instructed to schedule. Calcium rich diet, vitamin D 2000 daily encouraged. Repeat DEXA next year. Reviewed importance of increasing regular weightbearing exercise.    Maceo, 9:37 AM 01/12/2017

## 2017-01-12 NOTE — Patient Instructions (Signed)
shingrex  Vaccine  Shingles Schedule your mammogram!!! [place] Maintenance for Postmenopausal Women Menopause is a normal process in which your reproductive ability comes to an end. This process happens gradually over a span of months to years, usually between the ages of 61 and 61. Menopause is complete when you have missed 12 consecutive menstrual periods. It is important to talk with your health care provider about some of the most common conditions that affect postmenopausal women, such as heart disease, cancer, and bone loss (osteoporosis). Adopting a healthy lifestyle and getting preventive care can help to promote your health and wellness. Those actions can also lower your chances of developing some of these common conditions. What should I know about menopause? During menopause, you may experience a number of symptoms, such as:  Moderate-to-severe hot flashes.  Night sweats.  Decrease in sex drive.  Mood swings.  Headaches.  Tiredness.  Irritability.  Memory problems.  Insomnia.  Choosing to treat or not to treat menopausal changes is an individual decision that you make with your health care provider. What should I know about hormone replacement therapy and supplements? Hormone therapy products are effective for treating symptoms that are associated with menopause, such as hot flashes and night sweats. Hormone replacement carries certain risks, especially as you become older. If you are thinking about using estrogen or estrogen with progestin treatments, discuss the benefits and risks with your health care provider. What should I know about heart disease and stroke? Heart disease, heart attack, and stroke become more likely as you age. This may be due, in part, to the hormonal changes that your body experiences during menopause. These can affect how your body processes dietary fats, triglycerides, and cholesterol. Heart attack and stroke are both medical  emergencies. There are many things that you can do to help prevent heart disease and stroke:  Have your blood pressure checked at least every 1-2 years. High blood pressure causes heart disease and increases the risk of stroke.  If you are 61-61 years old, ask your health care provider if you should take aspirin to prevent a heart attack or a stroke.  Do not use any tobacco products, including cigarettes, chewing tobacco, or electronic cigarettes. If you need help quitting, ask your health care provider.  It is important to eat a healthy diet and maintain a healthy weight. ? Be sure to include plenty of vegetables, fruits, low-fat dairy products, and lean protein. ? Avoid eating foods that are high in solid fats, added sugars, or salt (sodium).  Get regular exercise. This is one of the most important things that you can do for your health. ? Try to exercise for at least 150 minutes each week. The type of exercise that you do should increase your heart rate and make you sweat. This is known as moderate-intensity exercise. ? Try to do strengthening exercises at least twice each week. Do these in addition to the moderate-intensity exercise.  Know your numbers.Ask your health care provider to check your cholesterol and your blood glucose. Continue to have your blood tested as directed by your health care provider.  What should I know about cancer screening? There are several types of cancer. Take the following steps to reduce your risk and to catch any cancer development as early as possible. Breast Cancer  Practice breast self-awareness. ? This means understanding how your breasts normally appear and feel. ? It also means doing regular breast self-exams. Let your health care provider know about  any changes, no matter how small.  If you are 61 or older, have a clinician do a breast exam (clinical breast exam or CBE) every year. Depending on your age, family history, and medical history, it  may be recommended that you also have a yearly breast X-ray (mammogram).  If you have a family history of breast cancer, talk with your health care provider about genetic screening.  If you are at high risk for breast cancer, talk with your health care provider about having an MRI and a mammogram every year.  Breast cancer (BRCA) gene test is recommended for women who have family members with BRCA-related cancers. Results of the assessment will determine the need for genetic counseling and BRCA1 and for BRCA2 testing. BRCA-related cancers include these types: ? Breast. This occurs in males or females. ? Ovarian. ? Tubal. This may also be called fallopian tube cancer. ? Cancer of the abdominal or pelvic lining (peritoneal cancer). ? Prostate. ? Pancreatic.  Cervical, Uterine, and Ovarian Cancer Your health care provider may recommend that you be screened regularly for cancer of the pelvic organs. These include your ovaries, uterus, and vagina. This screening involves a pelvic exam, which includes checking for microscopic changes to the surface of your cervix (Pap test).  For women ages 21-65, health care providers may recommend a pelvic exam and a Pap test every three years. For women ages 32-65, they may recommend the Pap test and pelvic exam, combined with testing for human papilloma virus (HPV), every five years. Some types of HPV increase your risk of cervical cancer. Testing for HPV may also be done on women of any age who have unclear Pap test results.  Other health care providers may not recommend any screening for nonpregnant women who are considered low risk for pelvic cancer and have no symptoms. Ask your health care provider if a screening pelvic exam is right for you.  If you have had past treatment for cervical cancer or a condition that could lead to cancer, you need Pap tests and screening for cancer for at least 20 years after your treatment. If Pap tests have been discontinued  for you, your risk factors (such as having a new sexual partner) need to be reassessed to determine if you should start having screenings again. Some women have medical problems that increase the chance of getting cervical cancer. In these cases, your health care provider may recommend that you have screening and Pap tests more often.  If you have a family history of uterine cancer or ovarian cancer, talk with your health care provider about genetic screening.  If you have vaginal bleeding after reaching menopause, tell your health care provider.  There are currently no reliable tests available to screen for ovarian cancer.  Lung Cancer Lung cancer screening is recommended for adults 57-12 years old who are at high risk for lung cancer because of a history of smoking. A yearly low-dose CT scan of the lungs is recommended if you:  Currently smoke.  Have a history of at least 30 pack-years of smoking and you currently smoke or have quit within the past 15 years. A pack-year is smoking an average of one pack of cigarettes per day for one year.  Yearly screening should:  Continue until it has been 15 years since you quit.  Stop if you develop a health problem that would prevent you from having lung cancer treatment.  Colorectal Cancer  This type of cancer can be detected and can  often be prevented.  Routine colorectal cancer screening usually begins at age 84 and continues through age 76.  If you have risk factors for colon cancer, your health care provider may recommend that you be screened at an earlier age.  If you have a family history of colorectal cancer, talk with your health care provider about genetic screening.  Your health care provider may also recommend using home test kits to check for hidden blood in your stool.  A small camera at the end of a tube can be used to examine your colon directly (sigmoidoscopy or colonoscopy). This is done to check for the earliest forms of  colorectal cancer.  Direct examination of the colon should be repeated every 5-10 years until age 20. However, if early forms of precancerous polyps or small growths are found or if you have a family history or genetic risk for colorectal cancer, you may need to be screened more often.  Skin Cancer  Check your skin from head to toe regularly.  Monitor any moles. Be sure to tell your health care provider: ? About any new moles or changes in moles, especially if there is a change in a mole's shape or color. ? If you have a mole that is larger than the size of a pencil eraser.  If any of your family members has a history of skin cancer, especially at a Aleiyah Halpin age, talk with your health care provider about genetic screening.  Always use sunscreen. Apply sunscreen liberally and repeatedly throughout the day.  Whenever you are outside, protect yourself by wearing long sleeves, pants, a wide-brimmed hat, and sunglasses.  What should I know about osteoporosis? Osteoporosis is a condition in which bone destruction happens more quickly than new bone creation. After menopause, you may be at an increased risk for osteoporosis. To help prevent osteoporosis or the bone fractures that can happen because of osteoporosis, the following is recommended:  If you are 19-78 years old, get at least 1,000 mg of calcium and at least 600 mg of vitamin D per day.  If you are older than age 22 but younger than age 62, get at least 1,200 mg of calcium and at least 600 mg of vitamin D per day.  If you are older than age 47, get at least 1,200 mg of calcium and at least 800 mg of vitamin D per day.  Smoking and excessive alcohol intake increase the risk of osteoporosis. Eat foods that are rich in calcium and vitamin D, and do weight-bearing exercises several times each week as directed by your health care provider. What should I know about how menopause affects my mental health? Depression may occur at any age, but it  is more common as you become older. Common symptoms of depression include:  Low or sad mood.  Changes in sleep patterns.  Changes in appetite or eating patterns.  Feeling an overall lack of motivation or enjoyment of activities that you previously enjoyed.  Frequent crying spells.  Talk with your health care provider if you think that you are experiencing depression. What should I know about immunizations? It is important that you get and maintain your immunizations. These include:  Tetanus, diphtheria, and pertussis (Tdap) booster vaccine.  Influenza every year before the flu season begins.  Pneumonia vaccine.  Shingles vaccine.  Your health care provider may also recommend other immunizations. This information is not intended to replace advice given to you by your health care provider. Make sure you discuss any  questions you have with your health care provider. Document Released: 03/20/2005 Document Revised: 08/16/2015 Document Reviewed: 10/30/2014 Elsevier Interactive Patient Education  2018 [employer].

## 2017-01-12 NOTE — Addendum Note (Signed)
Addended by: Lorine Bears on: 01/12/2017 09:51 AM   Modules accepted: Orders

## 2017-01-15 LAB — PAP, TP IMAGING W/ HPV RNA, RFLX HPV TYPE 16,18/45: HPV DNA HIGH RISK: NOT DETECTED

## 2017-01-22 ENCOUNTER — Telehealth: Payer: Self-pay | Admitting: *Deleted

## 2017-01-22 NOTE — Telephone Encounter (Signed)
Patient called asking if we have shingles vaccination here, I informed patient we do not. Pt did not need Rx, needs to have shot done at doctor office for her insurance to pay. Pt did check with PCP and it is on back order at his office.

## 2017-01-25 DIAGNOSIS — G4710 Hypersomnia, unspecified: Secondary | ICD-10-CM | POA: Diagnosis not present

## 2017-01-25 DIAGNOSIS — R0683 Snoring: Secondary | ICD-10-CM | POA: Diagnosis not present

## 2017-01-29 DIAGNOSIS — D126 Benign neoplasm of colon, unspecified: Secondary | ICD-10-CM | POA: Diagnosis not present

## 2017-01-29 DIAGNOSIS — Z1211 Encounter for screening for malignant neoplasm of colon: Secondary | ICD-10-CM | POA: Diagnosis not present

## 2017-01-29 DIAGNOSIS — D175 Benign lipomatous neoplasm of intra-abdominal organs: Secondary | ICD-10-CM | POA: Diagnosis not present

## 2017-03-18 DIAGNOSIS — R52 Pain, unspecified: Secondary | ICD-10-CM | POA: Diagnosis not present

## 2017-03-18 DIAGNOSIS — J101 Influenza due to other identified influenza virus with other respiratory manifestations: Secondary | ICD-10-CM | POA: Diagnosis not present

## 2017-04-01 DIAGNOSIS — I10 Essential (primary) hypertension: Secondary | ICD-10-CM | POA: Diagnosis not present

## 2017-04-01 DIAGNOSIS — E7800 Pure hypercholesterolemia, unspecified: Secondary | ICD-10-CM | POA: Diagnosis not present

## 2017-06-22 DIAGNOSIS — Z1231 Encounter for screening mammogram for malignant neoplasm of breast: Secondary | ICD-10-CM | POA: Diagnosis not present

## 2017-06-25 DIAGNOSIS — R928 Other abnormal and inconclusive findings on diagnostic imaging of breast: Secondary | ICD-10-CM | POA: Diagnosis not present

## 2017-06-25 DIAGNOSIS — N6001 Solitary cyst of right breast: Secondary | ICD-10-CM | POA: Diagnosis not present

## 2017-06-26 DIAGNOSIS — Z23 Encounter for immunization: Secondary | ICD-10-CM | POA: Diagnosis not present

## 2017-07-06 ENCOUNTER — Encounter: Payer: Self-pay | Admitting: Women's Health

## 2017-07-06 ENCOUNTER — Encounter: Payer: Self-pay | Admitting: Obstetrics & Gynecology

## 2017-07-19 DIAGNOSIS — M25532 Pain in left wrist: Secondary | ICD-10-CM | POA: Diagnosis not present

## 2017-08-16 ENCOUNTER — Encounter: Payer: Self-pay | Admitting: Women's Health

## 2017-08-23 DIAGNOSIS — M25532 Pain in left wrist: Secondary | ICD-10-CM | POA: Diagnosis not present

## 2017-08-24 ENCOUNTER — Encounter: Payer: Self-pay | Admitting: Neurology

## 2017-08-24 ENCOUNTER — Other Ambulatory Visit: Payer: Self-pay | Admitting: *Deleted

## 2017-08-24 DIAGNOSIS — M25539 Pain in unspecified wrist: Secondary | ICD-10-CM

## 2017-08-31 ENCOUNTER — Ambulatory Visit (INDEPENDENT_AMBULATORY_CARE_PROVIDER_SITE_OTHER): Payer: 59 | Admitting: Neurology

## 2017-08-31 DIAGNOSIS — M5412 Radiculopathy, cervical region: Secondary | ICD-10-CM

## 2017-08-31 DIAGNOSIS — M25539 Pain in unspecified wrist: Secondary | ICD-10-CM | POA: Diagnosis not present

## 2017-08-31 DIAGNOSIS — G5603 Carpal tunnel syndrome, bilateral upper limbs: Secondary | ICD-10-CM

## 2017-08-31 NOTE — Procedures (Signed)
[facility]  [address], [address]  [place], [place] [address] Tel: [phone] Fax:  [phone] Test Date:  08/31/2017  Patient: Cassidy Terry DOB: 16-Jun-1955 Physician: Narda Amber, DO  Sex: Female Height: 5\' 8"  Ref Phys: Daryll Brod, MD  ID#: [id number] Temp: 33.8C Technician:    Patient Complaints: This is a 62 year old female referred for evaluation of left wrist pain.  NCV & EMG Findings: Extensive electrodiagnostic testing of the left upper extremity and additional studies of the right shows:  1. Bilateral mixed median/ulnar (palm) comparison nerve showed prolonged latency. Bilateral median and ulnar sensory responses are within normal limits.  2. Bilateral median and ulnar motor responses are within normal limits.  3. Chronic motor axon loss changes are seen affecting the C8 myotomes bilaterally, without accompanied active denervation.   Impression: 1. Bilateral median neuropathy at or distal to the wrist, consistent with clinical diagnosis of carpal tunnel syndrome. Overall, these findings are very mild in degree electrically. 2. Chronic C8 radiculopathy affecting bilateral upper extremity, mild in degree electrically.   ___________________________ Narda Amber, DO    Nerve Conduction Studies Anti Sensory Summary Table   Site NR Peak (ms) Norm Peak (ms) P-T Amp (V) Norm P-T Amp  Left Median Anti Sensory (2nd Digit)  33.8C  Wrist    3.3 <3.8 44.5 >10  Right Median Anti Sensory (2nd Digit)  33.8C  Wrist    3.7 <3.8 46.5 >10  Left Ulnar Anti Sensory (5th Digit)  33.8C  Wrist    2.8 <3.2 43.8 >5  Right Ulnar Anti Sensory (5th Digit)  33.8C  Wrist    3.0 <3.2 44.3 >5   Motor Summary Table   Site NR Onset (ms) Norm Onset (ms) O-P Amp (mV) Norm O-P Amp Site1 Site2 Delta-0 (ms) Dist (cm) Vel (m/s) Norm Vel (m/s)  Left Median Motor (Abd Poll Brev)  33.8C  Wrist    3.4 <4.0 6.7 >5 Elbow Wrist 5.4 30.0 56 >50  Elbow    8.8  6.7         Right  Median Motor (Abd Poll Brev)  33.8C  Wrist    3.7 <4.0 8.4 >5 Elbow Wrist 5.1 29.0 57 >50  Elbow    8.8  8.2         Left Ulnar Motor (Abd Dig Minimi)  33.8C  Wrist    2.3 <3.1 12.8 >7 B Elbow Wrist 3.9 25.0 64 >50  B Elbow    6.2  12.1  A Elbow B Elbow 1.8 10.0 56 >50  A Elbow    8.0  11.6         Right Ulnar Motor (Abd Dig Minimi)  33.8C  Wrist    2.7 <3.1 13.4 >7 B Elbow Wrist 4.3 23.0 53 >50  B Elbow    7.0  12.6  A Elbow B Elbow 1.8 10.0 56 >50  A Elbow    8.8  12.2          Comparison Summary Table   Site NR Peak (ms) Norm Peak (ms) P-T Amp (V) Site1 Site2 Delta-P (ms) Norm Delta (ms)  Left Median/Ulnar Palm Comparison (Wrist - 8cm)  33.8C  Median Palm    1.9 <2.2 37.6 Median Palm Ulnar Palm 0.4   Ulnar Palm    1.5 <2.2 14.5      Right Median/Ulnar Palm Comparison (Wrist - 8cm)  33.8C  Median Palm    2.3 <2.2 38.3 Median Palm Ulnar Palm 0.7  Ulnar Palm    1.6 <2.2 19.0       EMG   Side Muscle Ins Act Fibs Psw Fasc Number Recrt Dur Dur. Amp Amp. Poly Poly. Comment  Left 1stDorInt Nml Nml Nml Nml 1- Rapid Some 1+ Some 1+ Nml Nml N/A  Left Abd Poll Brev Nml Nml Nml Nml Nml Nml Nml Nml Nml Nml Nml Nml N/A  Left Ext Indicis Nml Nml Nml Nml 1- Rapid Some 1+ Some 1+ Nml Nml N/A  Left PronatorTeres Nml Nml Nml Nml Nml Nml Nml Nml Nml Nml Nml Nml N/A  Left Biceps Nml Nml Nml Nml Nml Nml Nml Nml Nml Nml Nml Nml N/A  Left Triceps Nml Nml Nml Nml 1- Rapid Some 1+ Some 1+ Nml Nml N/A  Left Deltoid Nml Nml Nml Nml Nml Nml Nml Nml Nml Nml Nml Nml N/A  Right 1stDorInt Nml Nml Nml Nml 1- Rapid Some 1+ Some 1+ Nml Nml N/A  Right Ext Indicis Nml Nml Nml Nml 1- Rapid Some 1+ Some 1+ Nml Nml N/A  Right Abd Poll Brev Nml Nml Nml Nml Nml Nml Nml Nml Nml Nml Nml Nml N/A      Waveforms:

## 2017-09-08 DIAGNOSIS — M1812 Unilateral primary osteoarthritis of first carpometacarpal joint, left hand: Secondary | ICD-10-CM | POA: Diagnosis not present

## 2017-09-08 DIAGNOSIS — M5412 Radiculopathy, cervical region: Secondary | ICD-10-CM | POA: Diagnosis not present

## 2017-09-08 DIAGNOSIS — G5603 Carpal tunnel syndrome, bilateral upper limbs: Secondary | ICD-10-CM | POA: Diagnosis not present

## 2017-09-21 ENCOUNTER — Encounter

## 2017-09-21 ENCOUNTER — Encounter: Payer: 59 | Admitting: Neurology

## 2017-09-26 DIAGNOSIS — Z23 Encounter for immunization: Secondary | ICD-10-CM | POA: Diagnosis not present

## 2017-11-03 DIAGNOSIS — M5412 Radiculopathy, cervical region: Secondary | ICD-10-CM | POA: Diagnosis not present

## 2017-11-10 DIAGNOSIS — M4802 Spinal stenosis, cervical region: Secondary | ICD-10-CM | POA: Diagnosis not present

## 2017-11-10 DIAGNOSIS — M5412 Radiculopathy, cervical region: Secondary | ICD-10-CM | POA: Diagnosis not present

## 2017-11-15 DIAGNOSIS — M5412 Radiculopathy, cervical region: Secondary | ICD-10-CM | POA: Diagnosis not present

## 2017-11-22 DIAGNOSIS — Z85828 Personal history of other malignant neoplasm of skin: Secondary | ICD-10-CM | POA: Diagnosis not present

## 2017-11-22 DIAGNOSIS — L400 Psoriasis vulgaris: Secondary | ICD-10-CM | POA: Diagnosis not present

## 2017-11-22 DIAGNOSIS — L821 Other seborrheic keratosis: Secondary | ICD-10-CM | POA: Diagnosis not present

## 2017-11-30 DIAGNOSIS — Z23 Encounter for immunization: Secondary | ICD-10-CM | POA: Diagnosis not present

## 2017-12-06 DIAGNOSIS — H25813 Combined forms of age-related cataract, bilateral: Secondary | ICD-10-CM | POA: Diagnosis not present

## 2017-12-06 DIAGNOSIS — H47321 Drusen of optic disc, right eye: Secondary | ICD-10-CM | POA: Diagnosis not present

## 2017-12-06 DIAGNOSIS — H04123 Dry eye syndrome of bilateral lacrimal glands: Secondary | ICD-10-CM | POA: Diagnosis not present

## 2017-12-29 DIAGNOSIS — G5603 Carpal tunnel syndrome, bilateral upper limbs: Secondary | ICD-10-CM | POA: Diagnosis not present

## 2018-01-14 DIAGNOSIS — H02834 Dermatochalasis of left upper eyelid: Secondary | ICD-10-CM | POA: Diagnosis not present

## 2018-01-14 DIAGNOSIS — H02831 Dermatochalasis of right upper eyelid: Secondary | ICD-10-CM | POA: Diagnosis not present

## 2018-01-14 DIAGNOSIS — H04123 Dry eye syndrome of bilateral lacrimal glands: Secondary | ICD-10-CM | POA: Diagnosis not present

## 2018-01-17 DIAGNOSIS — H02834 Dermatochalasis of left upper eyelid: Secondary | ICD-10-CM | POA: Diagnosis not present

## 2018-01-17 DIAGNOSIS — H02831 Dermatochalasis of right upper eyelid: Secondary | ICD-10-CM | POA: Diagnosis not present

## 2018-01-19 ENCOUNTER — Other Ambulatory Visit: Payer: Self-pay | Admitting: Orthopedic Surgery

## 2018-02-16 ENCOUNTER — Encounter (HOSPITAL_BASED_OUTPATIENT_CLINIC_OR_DEPARTMENT_OTHER): Payer: Self-pay | Admitting: *Deleted

## 2018-02-16 ENCOUNTER — Other Ambulatory Visit: Payer: Self-pay

## 2018-02-21 DIAGNOSIS — E039 Hypothyroidism, unspecified: Secondary | ICD-10-CM | POA: Diagnosis not present

## 2018-02-21 DIAGNOSIS — Z0000 Encounter for general adult medical examination without abnormal findings: Secondary | ICD-10-CM | POA: Diagnosis not present

## 2018-02-21 DIAGNOSIS — E785 Hyperlipidemia, unspecified: Secondary | ICD-10-CM | POA: Diagnosis not present

## 2018-02-23 NOTE — Progress Notes (Signed)
Left message at PCP's office to obtain EKG.

## 2018-02-24 ENCOUNTER — Ambulatory Visit (HOSPITAL_BASED_OUTPATIENT_CLINIC_OR_DEPARTMENT_OTHER)
Admission: RE | Admit: 2018-02-24 | Discharge: 2018-02-24 | Disposition: A | Discharge status: Home / Self Care | Payer: 59 | Attending: Orthopedic Surgery | Admitting: Orthopedic Surgery

## 2018-02-24 ENCOUNTER — Ambulatory Visit (HOSPITAL_BASED_OUTPATIENT_CLINIC_OR_DEPARTMENT_OTHER): Payer: 59 | Admitting: Certified Registered"

## 2018-02-24 ENCOUNTER — Other Ambulatory Visit: Payer: Self-pay

## 2018-02-24 ENCOUNTER — Encounter (HOSPITAL_BASED_OUTPATIENT_CLINIC_OR_DEPARTMENT_OTHER): Payer: Self-pay | Admitting: *Deleted

## 2018-02-24 ENCOUNTER — Encounter (HOSPITAL_BASED_OUTPATIENT_CLINIC_OR_DEPARTMENT_OTHER)
Admission: RE | Disposition: A | Discharge status: Home / Self Care | Payer: Self-pay | Source: Home / Self Care | Attending: Orthopedic Surgery

## 2018-02-24 DIAGNOSIS — Z87891 Personal history of nicotine dependence: Secondary | ICD-10-CM | POA: Diagnosis not present

## 2018-02-24 DIAGNOSIS — I10 Essential (primary) hypertension: Secondary | ICD-10-CM | POA: Diagnosis not present

## 2018-02-24 DIAGNOSIS — G5603 Carpal tunnel syndrome, bilateral upper limbs: Secondary | ICD-10-CM | POA: Diagnosis not present

## 2018-02-24 DIAGNOSIS — G5602 Carpal tunnel syndrome, left upper limb: Secondary | ICD-10-CM | POA: Diagnosis not present

## 2018-02-24 DIAGNOSIS — R200 Anesthesia of skin: Secondary | ICD-10-CM | POA: Diagnosis present

## 2018-02-24 HISTORY — DX: Essential (primary) hypertension: I10

## 2018-02-24 HISTORY — PX: CARPAL TUNNEL RELEASE: SHX101

## 2018-02-24 SURGERY — CARPAL TUNNEL RELEASE
Anesthesia: Regional | Site: Wrist | Laterality: Left

## 2018-02-24 MED ORDER — ACETAMINOPHEN 160 MG/5ML PO SOLN: 325.0000 mg | ORAL | Status: DC | PRN

## 2018-02-24 MED ORDER — PROPOFOL 500 MG/50ML IV EMUL
INTRAVENOUS | Status: DC | PRN
  Administered 2018-02-24: 50 ug/kg/min via INTRAVENOUS

## 2018-02-24 MED ORDER — ONDANSETRON HCL 4 MG/2ML IJ SOLN
INTRAMUSCULAR | Status: DC | PRN
  Administered 2018-02-24: 4 mg via INTRAVENOUS

## 2018-02-24 MED ORDER — SCOPOLAMINE 1 MG/3DAYS TD PT72: 1.0000 | MEDICATED_PATCH | Freq: Once | TRANSDERMAL | Status: DC | PRN

## 2018-02-24 MED ORDER — FENTANYL CITRATE (PF) 100 MCG/2ML IJ SOLN
INTRAMUSCULAR | Status: AC
  Filled 2018-02-24: qty 2

## 2018-02-24 MED ORDER — CEFAZOLIN SODIUM-DEXTROSE 2-4 GM/100ML-% IV SOLN
INTRAVENOUS | Status: AC
  Filled 2018-02-24: qty 100

## 2018-02-24 MED ORDER — ONDANSETRON HCL 4 MG/2ML IJ SOLN
INTRAMUSCULAR | Status: AC
  Filled 2018-02-24: qty 2

## 2018-02-24 MED ORDER — FENTANYL CITRATE (PF) 100 MCG/2ML IJ SOLN: 50.0000 ug | INTRAMUSCULAR | Status: DC | PRN

## 2018-02-24 MED ORDER — OXYCODONE HCL 5 MG/5ML PO SOLN: 5.0000 mg | Freq: Once | ORAL | Status: DC | PRN

## 2018-02-24 MED ORDER — MIDAZOLAM HCL 2 MG/2ML IJ SOLN
INTRAMUSCULAR | Status: AC
  Filled 2018-02-24: qty 2

## 2018-02-24 MED ORDER — MIDAZOLAM HCL 2 MG/2ML IJ SOLN: 1.0000 mg | INTRAMUSCULAR | Status: DC | PRN

## 2018-02-24 MED ORDER — MEPERIDINE HCL 25 MG/ML IJ SOLN: 6.2500 mg | INTRAMUSCULAR | Status: DC | PRN

## 2018-02-24 MED ORDER — OXYCODONE HCL 5 MG PO TABS: 5.0000 mg | ORAL_TABLET | Freq: Once | ORAL | Status: DC | PRN

## 2018-02-24 MED ORDER — BUPIVACAINE HCL (PF) 0.25 % IJ SOLN
INTRAMUSCULAR | Status: AC
  Filled 2018-02-24: qty 30

## 2018-02-24 MED ORDER — FENTANYL CITRATE (PF) 100 MCG/2ML IJ SOLN: 25.0000 ug | INTRAMUSCULAR | Status: DC | PRN

## 2018-02-24 MED ORDER — TRAMADOL HCL 50 MG PO TABS: 50.0000 mg | ORAL_TABLET | Freq: Four times a day (QID) | ORAL | 0 refills | Status: DC | PRN

## 2018-02-24 MED ORDER — ACETAMINOPHEN 325 MG PO TABS: 325.0000 mg | ORAL_TABLET | ORAL | Status: DC | PRN

## 2018-02-24 MED ORDER — CHLORHEXIDINE GLUCONATE 4 % EX LIQD: 60.0000 mL | Freq: Once | CUTANEOUS | Status: DC

## 2018-02-24 MED ORDER — FENTANYL CITRATE (PF) 100 MCG/2ML IJ SOLN
INTRAMUSCULAR | Status: DC | PRN
  Administered 2018-02-24 (×2): 50 ug via INTRAVENOUS

## 2018-02-24 MED ORDER — BUPIVACAINE HCL (PF) 0.25 % IJ SOLN
INTRAMUSCULAR | Status: DC | PRN
  Administered 2018-02-24: 8 mL

## 2018-02-24 MED ORDER — MIDAZOLAM HCL 5 MG/5ML IJ SOLN
INTRAMUSCULAR | Status: DC | PRN
  Administered 2018-02-24: 2 mg via INTRAVENOUS

## 2018-02-24 MED ORDER — CEFAZOLIN SODIUM-DEXTROSE 2-4 GM/100ML-% IV SOLN
2.0000 g | INTRAVENOUS | Status: AC
  Administered 2018-02-24: 2 g via INTRAVENOUS

## 2018-02-24 MED ORDER — ONDANSETRON HCL 4 MG/2ML IJ SOLN: 4.0000 mg | Freq: Once | INTRAMUSCULAR | Status: DC | PRN

## 2018-02-24 MED ORDER — LIDOCAINE HCL (PF) 0.5 % IJ SOLN
INTRAMUSCULAR | Status: DC | PRN
  Administered 2018-02-24: 30 mL via INTRAVENOUS

## 2018-02-24 MED ORDER — LACTATED RINGERS IV SOLN
INTRAVENOUS | Status: DC
  Administered 2018-02-24: 08:00:00 via INTRAVENOUS

## 2018-02-24 SURGICAL SUPPLY — 40 items
BLADE SURG 15 STRL LF DISP TIS (BLADE) ×1 IMPLANT
BLADE SURG 15 STRL SS (BLADE) ×3
BNDG CMPR 9X4 STRL LF SNTH (GAUZE/BANDAGES/DRESSINGS) ×1
BNDG COHESIVE 3X5 TAN STRL LF (GAUZE/BANDAGES/DRESSINGS) ×3 IMPLANT
BNDG ESMARK 4X9 LF (GAUZE/BANDAGES/DRESSINGS) ×2 IMPLANT
BNDG GAUZE ELAST 4 BULKY (GAUZE/BANDAGES/DRESSINGS) ×3 IMPLANT
CHLORAPREP W/TINT 26ML (MISCELLANEOUS) ×3 IMPLANT
CORD BIPOLAR FORCEPS 12FT (ELECTRODE) ×3 IMPLANT
COVER BACK TABLE 60X90IN (DRAPES) ×3 IMPLANT
COVER MAYO STAND STRL (DRAPES) ×3 IMPLANT
COVER WAND RF STERILE (DRAPES) ×2 IMPLANT
CUFF TOURNIQUET SINGLE 18IN (TOURNIQUET CUFF) ×3 IMPLANT
DRAPE EXTREMITY T 121X128X90 (DISPOSABLE) ×3 IMPLANT
DRAPE SURG 17X23 STRL (DRAPES) ×3 IMPLANT
DRSG PAD ABDOMINAL 8X10 ST (GAUZE/BANDAGES/DRESSINGS) ×3 IMPLANT
GAUZE SPONGE 4X4 12PLY STRL (GAUZE/BANDAGES/DRESSINGS) ×3 IMPLANT
GAUZE XEROFORM 1X8 LF (GAUZE/BANDAGES/DRESSINGS) ×3 IMPLANT
GLOVE BIOGEL PI IND STRL 7.0 (GLOVE) IMPLANT
GLOVE BIOGEL PI IND STRL 8.5 (GLOVE) ×1 IMPLANT
GLOVE BIOGEL PI INDICATOR 7.0 (GLOVE) ×2
GLOVE BIOGEL PI INDICATOR 8.5 (GLOVE) ×2
GLOVE SURG ORTHO 8.0 STRL STRW (GLOVE) ×1 IMPLANT
GLOVE SURG SS PI 6.5 STRL IVOR (GLOVE) ×2 IMPLANT
GLOVE SURG SYN 8.0 (GLOVE) ×6 IMPLANT
GLOVE SURG SYN 8.0 PF PI (GLOVE) IMPLANT
GOWN STRL REIN XL XLG (GOWN DISPOSABLE) ×2 IMPLANT
GOWN STRL REUS W/ TWL LRG LVL3 (GOWN DISPOSABLE) ×1 IMPLANT
GOWN STRL REUS W/TWL LRG LVL3 (GOWN DISPOSABLE) ×3
GOWN STRL REUS W/TWL XL LVL3 (GOWN DISPOSABLE) ×3 IMPLANT
NDL PRECISIONGLIDE 27X1.5 (NEEDLE) IMPLANT
NEEDLE PRECISIONGLIDE 27X1.5 (NEEDLE) ×3 IMPLANT
NS IRRIG 1000ML POUR BTL (IV SOLUTION) ×3 IMPLANT
PACK BASIN DAY SURGERY FS (CUSTOM PROCEDURE TRAY) ×3 IMPLANT
STOCKINETTE 4X48 STRL (DRAPES) ×3 IMPLANT
SUT ETHILON 4 0 PS 2 18 (SUTURE) ×3 IMPLANT
SUT VICRYL 4-0 PS2 18IN ABS (SUTURE) IMPLANT
SYR BULB 3OZ (MISCELLANEOUS) ×3 IMPLANT
SYR CONTROL 10ML LL (SYRINGE) IMPLANT
TOWEL GREEN STERILE FF (TOWEL DISPOSABLE) ×3 IMPLANT
UNDERPAD 30X30 (UNDERPADS AND DIAPERS) ×3 IMPLANT

## 2018-02-24 NOTE — Brief Op Note (Signed)
02/24/2018  9:10 AM  PATIENT:  Cassidy Terry  63 y.o. female  PRE-OPERATIVE DIAGNOSIS:  LEFT CARPAL TUNNEL SYNDROME  POST-OPERATIVE DIAGNOSIS:  LEFT CARPAL TUNNEL SYNDROME  PROCEDURE:  Procedure(s): LEFT CARPAL TUNNEL RELEASE (Left)  SURGEON:  Surgeon(s) and Role:    * Daryll Brod, MD - Primary  PHYSICIAN ASSISTANT:   ASSISTANTS: none   ANESTHESIA:   local, regional and IV sedation  EBL:  51mk BLOOD ADMINISTERED:none  DRAINS: none   LOCAL MEDICATIONS USED:  BUPIVICAINE   SPECIMEN:  No Specimen  DISPOSITION OF SPECIMEN:  N/A  COUNTS:  YES  TOURNIQUET:   Total Tourniquet Time Documented: Forearm (Left) - 17 minutes Total: Forearm (Left) - 17 minutes   DICTATION: .Viviann Spare Dictation  PLAN OF CARE: Discharge to home after PACU  PATIENT DISPOSITION:  PACU - hemodynamically stable.

## 2018-02-24 NOTE — Transfer of Care (Signed)
Immediate Anesthesia Transfer of Care Note  Patient: Cassidy Terry  Procedure(s) Performed: LEFT CARPAL TUNNEL RELEASE (Left Wrist)  Patient Location: PACU  Anesthesia Type:MAC  Level of Consciousness: awake, alert  and oriented  Airway & Oxygen Therapy: Patient Spontanous Breathing  Post-op Assessment: Report given to RN and Post -op Vital signs reviewed and stable  Post vital signs: Reviewed and stable  Last Vitals:  Vitals Value Taken Time  BP    Temp    Pulse 71 02/24/2018  9:09 AM  Resp 14 02/24/2018  9:09 AM  SpO2 99 % 02/24/2018  9:09 AM  Vitals shown include unvalidated device data.  Last Pain:  Vitals:   02/24/18 0728  TempSrc: Oral  PainSc: 2       Patients Stated Pain Goal: 3 (80/22/33 6122)  Complications: No apparent anesthesia complications

## 2018-02-24 NOTE — H&P (Signed)
  Cassidy Terry is an 63 y.o. female.   Chief Complaint: numbness  Left hand HPI: Cassidy Terry is a 63yo female withr pain bilateral arms  left greater than right. She has seen Dr. Brigitte Pulse for her neck. They have cleared her following MRI. Continues to complain of numbness and tingling in the hand pain in her forearm left side greater than right. There is mild to moderate in nature. This has not responded to conservative treatment including injections which gave her only mild relief of symptoms. He complains of pain in the central aspect of her wrist left side along with numbness and tingling to the fingers. She has no history of diabetes thyroid problems arthritis or gout. She is negative for each but does have a history of hyperlipoidemia. Her nerve conductions are positive .    Past Medical History:  Diagnosis Date  . Hypertension   . Psoriasis     Past Surgical History:  Procedure Laterality Date  . ABDOMINAL HYSTERECTOMY     TAH FOR HEMATOMETRIA  . CESAREAN SECTION     X2  . DILATION AND CURETTAGE OF UTERUS     X2  . ENDOMETRIAL ABLATION     ENDOMYORESECTION  . FOOT SURGERY  10/2010   rt. foot, 2nd toe, 1 pin to stay in  . TUBAL LIGATION     LTL-FULGURATION/TRANSECTION    Family History  Problem Relation Age of Onset  . Hypertension Father   . Vaginal cancer Mother    Social History:  reports that she has quit smoking. She has never used smokeless tobacco. She reports current alcohol use. She reports that she does not use drugs.  Allergies:  Allergies  Allergen Reactions  . Latex     Fever blisters  . Sulfa Antibiotics     No medications prior to admission.    No results found for this or any previous visit (from the past 48 hour(s)).  No results found.   Pertinent items are noted in HPI.  Height 5\' 8"  (1.727 m), weight 77.1 kg, last menstrual period 11/06/2002.  General appearance: alert, cooperative and appears stated age Head: Normocephalic, without obvious  abnormality Neck: no JVD Resp: clear to auscultation bilaterally Cardio: regular rate and rhythm, S1, S2 normal, no murmur, click, rub or gallop GI: soft, non-tender; bowel sounds normal; no masses,  no organomegaly Extremities:  numbness  Left hand Pulses: 2+ and symmetric Skin: Skin color, texture, turgor normal. No rashes or lesions Neurologic: Grossly normal Incision/Wound: na  Assessment/Plan Assessment:  1. Bilateral carpal tunnel syndrome    Plan: Proceed with carpal tunnel release left hand. Pre-peri-and postoperative course been discussed along with risk complications. She is aware there is no guarantee to the surgery the possibility of infection recurrence injury to arteries nerves tendons complete relief symptoms dystrophy. She is scheduled for left carpal tunnel release in outpatient under regional anesthesia.   Cassidy Terry 02/24/2018, 5:37 AM

## 2018-02-24 NOTE — Discharge Instructions (Signed)

## 2018-02-24 NOTE — Anesthesia Postprocedure Evaluation (Signed)
Anesthesia Post Note  Patient: Cassidy Terry  Procedure(s) Performed: LEFT CARPAL TUNNEL RELEASE (Left Wrist)     Patient location during evaluation: PACU Anesthesia Type: Bier Block Level of consciousness: awake and alert Pain management: pain level controlled Vital Signs Assessment: post-procedure vital signs reviewed and stable Respiratory status: spontaneous breathing, nonlabored ventilation, respiratory function stable and patient connected to nasal cannula oxygen Cardiovascular status: stable and blood pressure returned to baseline Postop Assessment: no apparent nausea or vomiting Anesthetic complications: no    Last Vitals:  Vitals:   02/24/18 0915 02/24/18 0927  BP: 119/70 140/74  Pulse: 64 66  Resp: 13 18  Temp:  36.5 C  SpO2: 98% 96%    Last Pain:  Vitals:   02/24/18 0927  TempSrc: Oral  PainSc: 0-No pain                 Pecolia Marando

## 2018-02-24 NOTE — Op Note (Signed)
NAME: [place] RECORD NO: [id number] DATE OF BIRTH: Jun 30, 1955 FACILITY: [facility] LOCATION:  [facility] PHYSICIAN: Wynonia Sours, MD   OPERATIVE REPORT   DATE OF PROCEDURE: 02/24/18    PREOPERATIVE DIAGNOSIS:   Carpal tunnel syndrome left hand   POSTOPERATIVE DIAGNOSIS:   Same   PROCEDURE:   Compression median nerve left hand   SURGEON: Daryll Brod, M.D.   ASSISTANT: none   ANESTHESIA:  Regional with sedation and Local   INTRAVENOUS FLUIDS:  Per anesthesia flow sheet.   ESTIMATED BLOOD LOSS:  Minimal.   COMPLICATIONS:  None.   SPECIMENS:  none   TOURNIQUET TIME:    Total Tourniquet Time Documented: Forearm (Left) - 17 minutes Total: Forearm (Left) - 17 minutes    DISPOSITION:  Stable to PACU.   INDICATIONS: Patient is a 63 year old female with a history of numbness and tingling left hand.  Nerve conductions are positive and she has elected undergo surgical decompression of the median nerve left side.  Pre-peri-and postoperative course been discussed along with risks and complications.  She is aware that there is no guarantee to the surgery the possibility of infection recurrence injury to arteries nerves tendons complete relief symptoms to see the fact that we are attempting to halt the process following the nerve did possibly get better but cannot get guarantee that  OPERATIVE COURSE: Patient brought to the operating room where form based IV regional anesthetic was carried out without difficulty under the direction of the anesthesia department.  She was prepped using ChloraPrep in the supine position with a left arm free.  A three-minute dry time was allowed and a timeout taken to confirm patient procedure.  A longitudinal incision was made left palm carried down through subcutaneous tissue.  Bleeders were electrocauterized with bipolar.  The palmar fascia was split.  The superficial palmar arch was identified distally the flexor tendon of the  ring little finger was identified retractors were placed retracting median nerve flexor tendons radially and the ulnar nerve ulnarly.  The flexor retinaculum was then released on its ulnar border.  Right angle and saw retractor was placed between skin and forearm fascia and the fascia was released for approximately 2 to 3 cm proximal to the wrist crease under direct vision.  The motor branch entered centrally distally and was intact.  The nerve showed area compression no further lesions identified.  The wound was copiously irrigated with saline.  The skin was closed with 4 nylon sutures.  Local infiltration quarter percent bupivacaine without epinephrine was given 8 cc was used.  Sterile compressive dressing was applied with the fingers free.  Deflation of the tourniquet all fingers immediately pink.  She was taken to the recovery room for observation in satisfactory condition.  She will be discharged home to return to [facility] in 1 week Tylenol and ibuprofen with Ultram as a backup.  Daryll Brod, MD Electronically signed, 02/24/18

## 2018-02-24 NOTE — Anesthesia Preprocedure Evaluation (Addendum)
Anesthesia Evaluation  Patient identified by MRN, date of birth, ID band Patient awake    Reviewed: Allergy & Precautions, H&P , NPO status , Patient's Chart, lab work & pertinent test results, reviewed documented beta blocker date and time   Airway Mallampati: II  TM Distance: <3 FB Neck ROM: full   Comment: ANTERIOR  Dental no notable dental hx. (+) Teeth Intact   Pulmonary former smoker,    Pulmonary exam normal breath sounds clear to auscultation       Cardiovascular Exercise Tolerance: Good hypertension, Pt. on medications negative cardio ROS   Rhythm:regular Rate:Normal     Neuro/Psych negative neurological ROS  negative psych ROS   GI/Hepatic negative GI ROS, Neg liver ROS,   Endo/Other  negative endocrine ROS  Renal/GU negative Renal ROS  negative genitourinary   Musculoskeletal   Abdominal   Peds  Hematology negative hematology ROS (+)   Anesthesia Other Findings   Reproductive/Obstetrics negative OB ROS                            Anesthesia Physical Anesthesia Plan  ASA: II  Anesthesia Plan: Bier Block and Bier Block-LIDOCAINE ONLY   Post-op Pain Management:    Induction:   PONV Risk Score and Plan: 2 and Ondansetron and Treatment may vary due to age or medical condition  Airway Management Planned: Nasal Cannula and Mask  Additional Equipment:   Intra-op Plan:   Post-operative Plan:   Informed Consent: I have reviewed the patients History and Physical, chart, labs and discussed the procedure including the risks, benefits and alternatives for the proposed anesthesia with the patient or authorized representative who has indicated his/her understanding and acceptance.     Dental Advisory Given  Plan Discussed with: CRNA, Anesthesiologist and Surgeon  Anesthesia Plan Comments:         Anesthesia Quick Evaluation

## 2018-02-25 ENCOUNTER — Encounter (HOSPITAL_BASED_OUTPATIENT_CLINIC_OR_DEPARTMENT_OTHER): Payer: Self-pay | Admitting: Orthopedic Surgery

## 2018-02-28 ENCOUNTER — Other Ambulatory Visit: Payer: Self-pay | Admitting: Family Medicine

## 2018-02-28 DIAGNOSIS — E039 Hypothyroidism, unspecified: Secondary | ICD-10-CM

## 2018-03-02 ENCOUNTER — Ambulatory Visit
Admission: RE | Admit: 2018-03-02 | Discharge: 2018-03-02 | Disposition: A | Discharge status: Home / Self Care | Payer: 59 | Source: Ambulatory Visit | Attending: Family Medicine | Admitting: Family Medicine

## 2018-03-02 DIAGNOSIS — E039 Hypothyroidism, unspecified: Secondary | ICD-10-CM

## 2018-03-02 DIAGNOSIS — E038 Other specified hypothyroidism: Secondary | ICD-10-CM | POA: Diagnosis not present

## 2018-03-07 DIAGNOSIS — H53453 Other localized visual field defect, bilateral: Secondary | ICD-10-CM | POA: Diagnosis not present

## 2018-03-07 DIAGNOSIS — H02831 Dermatochalasis of right upper eyelid: Secondary | ICD-10-CM | POA: Diagnosis not present

## 2018-03-07 DIAGNOSIS — H02834 Dermatochalasis of left upper eyelid: Secondary | ICD-10-CM | POA: Diagnosis not present

## 2018-04-28 DIAGNOSIS — E785 Hyperlipidemia, unspecified: Secondary | ICD-10-CM | POA: Diagnosis not present

## 2018-04-28 DIAGNOSIS — E039 Hypothyroidism, unspecified: Secondary | ICD-10-CM | POA: Diagnosis not present

## 2018-06-10 DIAGNOSIS — M1812 Unilateral primary osteoarthritis of first carpometacarpal joint, left hand: Secondary | ICD-10-CM | POA: Diagnosis not present

## 2018-06-10 DIAGNOSIS — G5603 Carpal tunnel syndrome, bilateral upper limbs: Secondary | ICD-10-CM | POA: Diagnosis not present

## 2018-06-10 DIAGNOSIS — R52 Pain, unspecified: Secondary | ICD-10-CM | POA: Diagnosis not present

## 2019-02-20 ENCOUNTER — Encounter: Payer: Self-pay | Admitting: Women's Health

## 2019-02-22 ENCOUNTER — Other Ambulatory Visit: Payer: Self-pay | Admitting: Radiology

## 2019-03-31 ENCOUNTER — Encounter: Payer: Self-pay | Admitting: Women's Health

## 2019-04-19 ENCOUNTER — Encounter: Payer: Self-pay | Admitting: Women's Health

## 2019-04-21 ENCOUNTER — Other Ambulatory Visit: Payer: Self-pay | Admitting: Orthopedic Surgery

## 2019-04-21 DIAGNOSIS — M24132 Other articular cartilage disorders, left wrist: Secondary | ICD-10-CM

## 2019-05-18 ENCOUNTER — Ambulatory Visit
Admission: RE | Admit: 2019-05-18 | Discharge: 2019-05-18 | Disposition: A | Discharge status: Home / Self Care | Payer: 59 | Source: Ambulatory Visit | Attending: Orthopedic Surgery | Admitting: Orthopedic Surgery

## 2019-05-18 ENCOUNTER — Other Ambulatory Visit: Payer: Self-pay

## 2019-05-18 DIAGNOSIS — M24132 Other articular cartilage disorders, left wrist: Secondary | ICD-10-CM

## 2019-05-18 MED ORDER — IOPAMIDOL (ISOVUE-M 200) INJECTION 41%
2.0000 mL | Freq: Once | INTRAMUSCULAR | Status: AC
  Administered 2019-05-18: 15:00:00 2 mL via INTRA_ARTICULAR

## 2019-12-28 ENCOUNTER — Other Ambulatory Visit: Payer: Self-pay | Admitting: General Surgery

## 2020-02-05 NOTE — Progress Notes (Addendum)
Your procedure is scheduled on Tuesday, February 13, 2020.  Report to [facility] Main Entrance "A" at 8:00 A.M., and check in at the Admitting office.  Call this number if you have problems the morning of surgery:  [phone]  Call [phone] if you have any questions prior to your surgery date Monday-Friday 8am-4pm    Remember:  Do not eat after midnight the night before your surgery  You may drink clear liquids until 7:00 AM the morning of your surgery.   Clear liquids allowed are: Water, Non-Citrus Juices (without pulp), Carbonated Beverages, Clear Tea, Black Coffee Only, and Gatorade  Please complete your PRE-SURGERY ENSURE that was provided to you by 7:00 AM the morning of surgery.  Please, if able, drink it in one setting. DO NOT SIP.    Take these medicines the morning of surgery with A SIP OF WATER:  cetirizine (ZYRTEC)  escitalopram (LEXAPRO)  ezetimibe (ZETIA) levothyroxine (SYNTHROID)   As of today, STOP taking any Aspirin (unless otherwise instructed by your surgeon) Aleve, Naproxen, Ibuprofen, Motrin, Advil, Goody's, BC's, all herbal medications, fish oil, and all vitamins.                      Do not wear jewelry, make up, or nail polish            Do not wear lotions, powders, perfumes, or deodorant.            Do not shave 48 hours prior to surgery.            Do not bring valuables to the hospital.            [facility] is not responsible for any belongings or valuables.  Do NOT Smoke (Tobacco/Vaping) or drink Alcohol 24 hours prior to your procedure If you use a CPAP at night, you may bring all equipment for your overnight stay.   Contacts, glasses, dentures or bridgework may not be worn into surgery.      For patients admitted to the hospital, discharge time will be determined by your treatment team.   Patients discharged the day of surgery will not be allowed to drive home, and someone needs to stay with them for 24 hours.    Special instructions:    [facility]- Preparing For Surgery  Before surgery, you can play an important role. Because skin is not sterile, your skin needs to be as free of germs as possible. You can reduce the number of germs on your skin by washing with CHG (chlorahexidine gluconate) Soap before surgery.  CHG is an antiseptic cleaner which kills germs and bonds with the skin to continue killing germs even after washing.    Oral Hygiene is also important to reduce your risk of infection.  Remember - BRUSH YOUR TEETH THE MORNING OF SURGERY WITH YOUR REGULAR TOOTHPASTE  Please do not use if you have an allergy to CHG or antibacterial soaps. If your skin becomes reddened/irritated stop using the CHG.  Do not shave (including legs and underarms) for at least 48 hours prior to first CHG shower. It is OK to shave your face.  Please follow these instructions carefully.   1. Shower the NIGHT BEFORE SURGERY and the MORNING OF SURGERY with CHG Soap.   2. If you chose to wash your hair, wash your hair first as usual with your normal shampoo.  3. After you shampoo, rinse your hair and body thoroughly to remove the shampoo.  4.  Use CHG as you would any other liquid soap. You can apply CHG directly to the skin and wash gently with a scrungie or a clean washcloth.   5. Apply the CHG Soap to your body ONLY FROM THE NECK DOWN.  Do not use on open wounds or open sores. Avoid contact with your eyes, ears, mouth and genitals (private parts). Wash Face and genitals (private parts)  with your normal soap.   6. Wash thoroughly, paying special attention to the area where your surgery will be performed.  7. Thoroughly rinse your body with warm water from the neck down.  8. DO NOT shower/wash with your normal soap after using and rinsing off the CHG Soap.  9. Pat yourself dry with a CLEAN TOWEL.  10. Wear CLEAN PAJAMAS to bed the night before surgery  11. Place CLEAN SHEETS on your bed the night of your first shower and DO NOT SLEEP  WITH PETS.   Day of Surgery: Wear Clean/Comfortable clothing the morning of surgery Do not apply any deodorants/lotions.   Remember to brush your teeth WITH YOUR REGULAR TOOTHPASTE.   Please read over the following fact sheets that you were given.

## 2020-02-06 ENCOUNTER — Other Ambulatory Visit: Payer: Self-pay

## 2020-02-06 ENCOUNTER — Encounter (HOSPITAL_COMMUNITY): Payer: Self-pay

## 2020-02-06 ENCOUNTER — Encounter (HOSPITAL_COMMUNITY)
Admission: RE | Admit: 2020-02-06 | Discharge: 2020-02-06 | Disposition: A | Discharge status: Home / Self Care | Payer: 59 | Source: Ambulatory Visit | Attending: General Surgery | Admitting: General Surgery

## 2020-02-06 DIAGNOSIS — Z01818 Encounter for other preprocedural examination: Secondary | ICD-10-CM | POA: Insufficient documentation

## 2020-02-06 HISTORY — DX: Sleep apnea, unspecified: G47.30

## 2020-02-06 HISTORY — DX: Hypothyroidism, unspecified: E03.9

## 2020-02-06 HISTORY — DX: Depression, unspecified: F32.A

## 2020-02-06 LAB — BASIC METABOLIC PANEL
Anion gap: 9 (ref 5–15)
BUN: 15 mg/dL (ref 8–23)
CO2: 27 mmol/L (ref 22–32)
Calcium: 9.5 mg/dL (ref 8.9–10.3)
Chloride: 103 mmol/L (ref 98–111)
Creatinine, Ser: 0.67 mg/dL (ref 0.44–1.00)
GFR, Estimated: >60 mL/min (ref >60)
Glucose, Bld: 97 mg/dL (ref 70–99)
Potassium: 4.3 mmol/L (ref 3.5–5.1)
Sodium: 139 mmol/L (ref 135–145)

## 2020-02-06 LAB — CBC
HCT: 44 % (ref 36.0–46.0)
Hemoglobin: 14.7 g/dL (ref 12.0–15.0)
MCH: 32.5 pg (ref 26.0–34.0)
MCHC: 33.4 g/dL (ref 30.0–36.0)
MCV: 97.3 fL (ref 80.0–100.0)
Platelets: 227 10*3/uL (ref 150–400)
RBC: 4.52 MIL/uL (ref 3.87–5.11)
RDW: 12.5 % (ref 11.5–15.5)
WBC: 6.4 10*3/uL (ref 4.0–10.5)
nRBC: 0 % (ref 0.0–0.2)

## 2020-02-06 NOTE — Progress Notes (Signed)
PCP - Dr. Lynford Citizen  Cardiologist - Denies  Chest x-ray - Denies  EKG - 02/06/20  Stress Test - Denies  ECHO - Denies  Cardiac Cath - Denies  AICD-na PM-na LOOP-na  Sleep Study - Yes- Positive CPAP - Denies  LABS- 02/06/20: CBC, BMP 02/09/20: COVID  ASA- Denies  ERAS- Yes- Ensure x1 given  HA1C- Denies  Anesthesia- No  Pt denies having chest pain, sob, or fever at this time. All instructions explained to the pt, with a verbal understanding of the material. Pt agrees to go over the instructions while at home for a better understanding. Pt also instructed to self quarantine after being tested for COVID-19. The opportunity to ask questions was provided.   Coronavirus Screening  Have you experienced the following symptoms:  Cough yes/no: No Fever (>100.42F)  yes/no: No Runny nose yes/no: No Sore throat yes/no: No Difficulty breathing/shortness of breath  yes/no: No  Have you or a family member traveled in the last 14 days and where? yes/no: No   If the patient indicates "YES" to the above questions, their PAT will be rescheduled to limit the exposure to others and, the surgeon will be notified. THE PATIENT WILL NEED TO BE ASYMPTOMATIC FOR 14 DAYS.   If the patient is not experiencing any of these symptoms, the PAT nurse will instruct them to NOT bring anyone with them to their appointment since they may have these symptoms or traveled as well.   Please remind your patients and families that hospital visitation restrictions are in effect and the importance of the restrictions.

## 2020-02-09 ENCOUNTER — Other Ambulatory Visit (HOSPITAL_COMMUNITY)
Admission: RE | Admit: 2020-02-09 | Discharge: 2020-02-09 | Disposition: A | Discharge status: Home / Self Care | Payer: 59 | Source: Ambulatory Visit | Attending: General Surgery | Admitting: General Surgery

## 2020-02-09 DIAGNOSIS — Z20822 Contact with and (suspected) exposure to covid-19: Secondary | ICD-10-CM | POA: Diagnosis not present

## 2020-02-09 DIAGNOSIS — Z01812 Encounter for preprocedural laboratory examination: Secondary | ICD-10-CM | POA: Diagnosis not present

## 2020-02-09 LAB — SARS CORONAVIRUS 2 (TAT 6-24 HRS): SARS Coronavirus 2: NEGATIVE

## 2020-02-13 ENCOUNTER — Ambulatory Visit (HOSPITAL_BASED_OUTPATIENT_CLINIC_OR_DEPARTMENT_OTHER)
Admission: RE | Admit: 2020-02-13 | Discharge: 2020-02-13 | Disposition: A | Discharge status: Home / Self Care | Payer: 59 | Attending: General Surgery | Admitting: General Surgery

## 2020-02-13 ENCOUNTER — Other Ambulatory Visit: Payer: Self-pay

## 2020-02-13 ENCOUNTER — Encounter (HOSPITAL_BASED_OUTPATIENT_CLINIC_OR_DEPARTMENT_OTHER): Payer: Self-pay | Admitting: General Surgery

## 2020-02-13 ENCOUNTER — Encounter (HOSPITAL_BASED_OUTPATIENT_CLINIC_OR_DEPARTMENT_OTHER)
Admission: RE | Disposition: A | Discharge status: Home / Self Care | Payer: Self-pay | Source: Home / Self Care | Attending: General Surgery

## 2020-02-13 ENCOUNTER — Ambulatory Visit (HOSPITAL_BASED_OUTPATIENT_CLINIC_OR_DEPARTMENT_OTHER): Payer: 59 | Admitting: Anesthesiology

## 2020-02-13 DIAGNOSIS — Z79899 Other long term (current) drug therapy: Secondary | ICD-10-CM | POA: Diagnosis not present

## 2020-02-13 DIAGNOSIS — D1779 Benign lipomatous neoplasm of other sites: Secondary | ICD-10-CM | POA: Insufficient documentation

## 2020-02-13 DIAGNOSIS — Z79890 Hormone replacement therapy (postmenopausal): Secondary | ICD-10-CM | POA: Diagnosis not present

## 2020-02-13 HISTORY — PX: LIPOMA EXCISION: SHX5283

## 2020-02-13 SURGERY — EXCISION LIPOMA
Anesthesia: General | Site: Buttocks | Laterality: Left

## 2020-02-13 MED ORDER — PROPOFOL 10 MG/ML IV BOLUS
INTRAVENOUS | Status: AC
  Filled 2020-02-13: qty 20

## 2020-02-13 MED ORDER — FENTANYL CITRATE (PF) 100 MCG/2ML IJ SOLN
25.0000 ug | INTRAMUSCULAR | Status: DC | PRN
  Administered 2020-02-13: 25 ug via INTRAVENOUS

## 2020-02-13 MED ORDER — LIDOCAINE HCL (CARDIAC) PF 100 MG/5ML IV SOSY
PREFILLED_SYRINGE | INTRAVENOUS | Status: DC | PRN
  Administered 2020-02-13: 100 mg via INTRAVENOUS

## 2020-02-13 MED ORDER — FENTANYL CITRATE (PF) 100 MCG/2ML IJ SOLN
INTRAMUSCULAR | Status: AC
  Filled 2020-02-13: qty 2

## 2020-02-13 MED ORDER — ONDANSETRON HCL 4 MG/2ML IJ SOLN
INTRAMUSCULAR | Status: DC | PRN
  Administered 2020-02-13: 4 mg via INTRAVENOUS

## 2020-02-13 MED ORDER — PROPOFOL 10 MG/ML IV BOLUS
INTRAVENOUS | Status: DC | PRN
  Administered 2020-02-13: 150 mg via INTRAVENOUS

## 2020-02-13 MED ORDER — MIDAZOLAM HCL 2 MG/2ML IJ SOLN
INTRAMUSCULAR | Status: AC
  Filled 2020-02-13: qty 2

## 2020-02-13 MED ORDER — KETOROLAC TROMETHAMINE 30 MG/ML IJ SOLN
INTRAMUSCULAR | Status: AC
  Filled 2020-02-13: qty 1

## 2020-02-13 MED ORDER — ORAL CARE MOUTH RINSE: 15.0000 mL | Freq: Once | OROMUCOSAL | Status: DC

## 2020-02-13 MED ORDER — CHLORHEXIDINE GLUCONATE 0.12 % MT SOLN
15.0000 mL | Freq: Once | OROMUCOSAL | Status: DC
  Filled 2020-02-13: qty 15

## 2020-02-13 MED ORDER — FENTANYL CITRATE (PF) 100 MCG/2ML IJ SOLN
INTRAMUSCULAR | Status: DC | PRN
  Administered 2020-02-13: 50 ug via INTRAVENOUS

## 2020-02-13 MED ORDER — CEFAZOLIN SODIUM-DEXTROSE 2-4 GM/100ML-% IV SOLN
2.0000 g | INTRAVENOUS | Status: AC
  Administered 2020-02-13: 2 g via INTRAVENOUS

## 2020-02-13 MED ORDER — ENSURE PRE-SURGERY PO LIQD: 296.0000 mL | Freq: Once | ORAL | Status: DC

## 2020-02-13 MED ORDER — DEXAMETHASONE SODIUM PHOSPHATE 10 MG/ML IJ SOLN
INTRAMUSCULAR | Status: AC
  Filled 2020-02-13: qty 1

## 2020-02-13 MED ORDER — LACTATED RINGERS IV SOLN: INTRAVENOUS | Status: DC

## 2020-02-13 MED ORDER — ACETAMINOPHEN 500 MG PO TABS
1000.0000 mg | ORAL_TABLET | ORAL | Status: AC
  Administered 2020-02-13: 1000 mg via ORAL

## 2020-02-13 MED ORDER — CEFAZOLIN SODIUM-DEXTROSE 2-4 GM/100ML-% IV SOLN
INTRAVENOUS | Status: AC
  Filled 2020-02-13: qty 100

## 2020-02-13 MED ORDER — BUPIVACAINE HCL (PF) 0.25 % IJ SOLN
INTRAMUSCULAR | Status: DC | PRN
  Administered 2020-02-13: 12 mL

## 2020-02-13 MED ORDER — EPHEDRINE SULFATE 50 MG/ML IJ SOLN
INTRAMUSCULAR | Status: DC | PRN
  Administered 2020-02-13 (×2): 10 mg via INTRAVENOUS

## 2020-02-13 MED ORDER — KETOROLAC TROMETHAMINE 15 MG/ML IJ SOLN
15.0000 mg | Freq: Once | INTRAMUSCULAR | Status: AC | PRN
  Administered 2020-02-13: 15 mg via INTRAVENOUS

## 2020-02-13 MED ORDER — TRAMADOL HCL 50 MG PO TABS: 50.0000 mg | ORAL_TABLET | Freq: Four times a day (QID) | ORAL | 0 refills | Status: DC | PRN

## 2020-02-13 MED ORDER — LIDOCAINE 2% (20 MG/ML) 5 ML SYRINGE
INTRAMUSCULAR | Status: AC
  Filled 2020-02-13: qty 5

## 2020-02-13 MED ORDER — ONDANSETRON HCL 4 MG/2ML IJ SOLN
INTRAMUSCULAR | Status: AC
  Filled 2020-02-13: qty 2

## 2020-02-13 MED ORDER — MIDAZOLAM HCL 5 MG/5ML IJ SOLN
INTRAMUSCULAR | Status: DC | PRN
  Administered 2020-02-13: 2 mg via INTRAVENOUS

## 2020-02-13 MED ORDER — ONDANSETRON HCL 4 MG/2ML IJ SOLN: 4.0000 mg | Freq: Once | INTRAMUSCULAR | Status: DC | PRN

## 2020-02-13 MED ORDER — ACETAMINOPHEN 500 MG PO TABS
ORAL_TABLET | ORAL | Status: AC
  Filled 2020-02-13: qty 2

## 2020-02-13 MED ORDER — DEXAMETHASONE SODIUM PHOSPHATE 4 MG/ML IJ SOLN
INTRAMUSCULAR | Status: DC | PRN
  Administered 2020-02-13: 5 mg via INTRAVENOUS

## 2020-02-13 SURGICAL SUPPLY — 52 items
ADH SKN CLS APL DERMABOND .7 (GAUZE/BANDAGES/DRESSINGS) ×1
APL PRP STRL LF DISP 70% ISPRP (MISCELLANEOUS)
BLADE CLIPPER SURG (BLADE) IMPLANT
BLADE SURG 15 STRL LF DISP TIS (BLADE) ×1 IMPLANT
BLADE SURG 15 STRL SS (BLADE) ×2
CANISTER SUCT 1200ML W/VALVE (MISCELLANEOUS) IMPLANT
CHLORAPREP W/TINT 26 (MISCELLANEOUS) ×1 IMPLANT
COVER BACK TABLE 60X90IN (DRAPES) ×2 IMPLANT
COVER MAYO STAND STRL (DRAPES) ×2 IMPLANT
COVER WAND RF STERILE (DRAPES) IMPLANT
DECANTER SPIKE VIAL GLASS SM (MISCELLANEOUS) IMPLANT
DERMABOND ADVANCED (GAUZE/BANDAGES/DRESSINGS) ×1
DERMABOND ADVANCED .7 DNX12 (GAUZE/BANDAGES/DRESSINGS) ×1 IMPLANT
DRAPE LAPAROTOMY 100X72 PEDS (DRAPES) ×2 IMPLANT
DRSG TEGADERM 4X4.75 (GAUZE/BANDAGES/DRESSINGS) IMPLANT
ELECT COATED BLADE 2.86 ST (ELECTRODE) ×1 IMPLANT
ELECT REM PT RETURN 9FT ADLT (ELECTROSURGICAL) ×2
ELECTRODE REM PT RTRN 9FT ADLT (ELECTROSURGICAL) ×1 IMPLANT
GAUZE PACKING IODOFORM 1/4X15 (PACKING) IMPLANT
GAUZE SPONGE 4X4 12PLY STRL LF (GAUZE/BANDAGES/DRESSINGS) IMPLANT
GLOVE BIOGEL PI IND STRL 7.5 (GLOVE) ×1 IMPLANT
GLOVE BIOGEL PI INDICATOR 7.5 (GLOVE) ×1
GLOVE SURG ENC MOIS LTX SZ7 (GLOVE) ×1 IMPLANT
GLOVE SURG SS PI 7.0 STRL IVOR (GLOVE) ×1 IMPLANT
GOWN STRL REUS W/ TWL LRG LVL3 (GOWN DISPOSABLE) ×3 IMPLANT
GOWN STRL REUS W/ TWL XL LVL3 (GOWN DISPOSABLE) IMPLANT
GOWN STRL REUS W/TWL LRG LVL3 (GOWN DISPOSABLE) ×2
GOWN STRL REUS W/TWL XL LVL3 (GOWN DISPOSABLE) ×2
NDL HYPO 25X1 1.5 SAFETY (NEEDLE) ×1 IMPLANT
NEEDLE HYPO 25X1 1.5 SAFETY (NEEDLE) ×2 IMPLANT
NS IRRIG 1000ML POUR BTL (IV SOLUTION) ×1 IMPLANT
PACK BASIN DAY SURGERY FS (CUSTOM PROCEDURE TRAY) ×2 IMPLANT
PENCIL SMOKE EVACUATOR (MISCELLANEOUS) ×2 IMPLANT
STRIP CLOSURE SKIN 1/2X4 (GAUZE/BANDAGES/DRESSINGS) ×1 IMPLANT
SUT CHROMIC 4 0 PS 2 18 (SUTURE) ×1 IMPLANT
SUT ETHILON 2 0 FS 18 (SUTURE) IMPLANT
SUT MNCRL AB 4-0 PS2 18 (SUTURE) ×2 IMPLANT
SUT SILK 2 0 SH (SUTURE) IMPLANT
SUT VIC AB 2-0 SH 27 (SUTURE) ×2
SUT VIC AB 2-0 SH 27XBRD (SUTURE) IMPLANT
SUT VIC AB 3-0 SH 27 (SUTURE) ×2
SUT VIC AB 3-0 SH 27X BRD (SUTURE) IMPLANT
SUT VICRYL 3-0 CR8 SH (SUTURE) IMPLANT
SUT VICRYL 4-0 PS2 18IN ABS (SUTURE) IMPLANT
SWAB COLLECTION DEVICE MRSA (MISCELLANEOUS) IMPLANT
SWAB CULTURE ESWAB REG 1ML (MISCELLANEOUS) IMPLANT
SYR CONTROL 10ML LL (SYRINGE) ×2 IMPLANT
TOWEL GREEN STERILE FF (TOWEL DISPOSABLE) ×3 IMPLANT
TRAY DSU PREP LF (CUSTOM PROCEDURE TRAY) ×1 IMPLANT
TUBE CONNECTING 20X1/4 (TUBING) ×1 IMPLANT
UNDERPAD 30X36 HEAVY ABSORB (UNDERPADS AND DIAPERS) ×1 IMPLANT
YANKAUER SUCT BULB TIP NO VENT (SUCTIONS) ×1 IMPLANT

## 2020-02-13 NOTE — Op Note (Signed)
Preoperative diagnosis: left buttock/perineal lipoma Postoperative diagnosis: saa Procedure: excision of 5 cm left buttock/perineal lipoma Surgeon Dr Harden Mo Anesthesia: General Estimated blood loss: Minimal Specimens: Left buttock and perineal lipoma to pathology Complications: None Drains: None Special count was correct completion Disposition to recovery stable condition  Indications: 47 yof who has left buttock mass present for 3-4 years that is increasing in size and is now bothering her. We discussed excision in the operating room.  Procedure: After informed consent was obtained the patient was taken to the operating room.  She was given antibiotics.  SCDs were in place.  She was placed under general anesthesia without complication.  She was placed in the frog-leg position and appropriately padded.  She was prepped and draped in the standard sterile surgical fashion.  A surgical timeout was then performed.  I infiltrated Marcaine all around the lipoma as well as the exophytic area that was bothering her.  I then made an elliptical incision to remove this exophytic area as well as some of the overlying skin.  I then entered into the cavity and there clearly was an encapsulated lipoma.  It is an area where there is a fair amount of fatty tissue but I remove the encapsulated lipoma easily.  This was passed off the table as a specimen.  I then obtained hemostasis.  This was then closed with 2-0 Vicryl, 3-0 Vicryl, and a 4-0 Monocryl.  I placed 1 interrupted 4-0 chromic suture as well.  I did place a thin line of glue over the incision and then covered this with Steri-Strips.  She tolerated this well was extubated and transferred to recovery stable.

## 2020-02-13 NOTE — Anesthesia Postprocedure Evaluation (Signed)
Anesthesia Post Note  Patient: Cassidy Terry  Procedure(s) Performed: EXCISION SUBCUTANEOUS BUTTOCK LIPOMA (Left Buttocks)     Patient location during evaluation: PACU Anesthesia Type: General Level of consciousness: awake and alert Pain management: pain level controlled Vital Signs Assessment: post-procedure vital signs reviewed and stable Respiratory status: spontaneous breathing, nonlabored ventilation, respiratory function stable and patient connected to nasal cannula oxygen Cardiovascular status: blood pressure returned to baseline and stable Postop Assessment: no apparent nausea or vomiting Anesthetic complications: no   No complications documented.  Last Vitals:  Vitals:   02/13/20 1100 02/13/20 1129  BP: 133/73 (!) 150/77  Pulse: 77 73  Resp: 12 18  Temp:  36.5 C  SpO2: 98% 100%    Last Pain:  Vitals:   02/13/20 1129  TempSrc:   PainSc: 1                  Trevor Iha

## 2020-02-13 NOTE — Transfer of Care (Signed)
Immediate Anesthesia Transfer of Care Note  Patient: VIBHA FERDIG  Procedure(s) Performed: EXCISION SUBCUTANEOUS BUTTOCK LIPOMA (Left Buttocks)  Patient Location: PACU  Anesthesia Type:General  Level of Consciousness: sedated  Airway & Oxygen Therapy: Patient Spontanous Breathing and Patient connected to face mask oxygen  Post-op Assessment: Report given to RN and Post -op Vital signs reviewed and stable  Post vital signs: Reviewed and stable  Last Vitals:  Vitals Value Taken Time  BP 150/77 02/13/20 1129  Temp 36.5 C 02/13/20 1129  Pulse 73 02/13/20 1129  Resp 18 02/13/20 1129  SpO2 100 % 02/13/20 1129    Last Pain:  Vitals:   02/13/20 1129  TempSrc:   PainSc: 1          Complications: No complications documented.

## 2020-02-13 NOTE — Anesthesia Procedure Notes (Signed)
Procedure Name: LMA Insertion Date/Time: 02/13/2020 9:40 AM Performed by: Burna Cash, CRNA Pre-anesthesia Checklist: Patient identified, Emergency Drugs available, Suction available and Patient being monitored Patient Re-evaluated:Patient Re-evaluated prior to induction Oxygen Delivery Method: Circle system utilized Preoxygenation: Pre-oxygenation with 100% oxygen Induction Type: IV induction Ventilation: Mask ventilation without difficulty LMA: LMA inserted LMA Size: 4.0 Number of attempts: 1 Airway Equipment and Method: Bite block Placement Confirmation: positive ETCO2 Tube secured with: Tape Dental Injury: Teeth and Oropharynx as per pre-operative assessment

## 2020-02-13 NOTE — Anesthesia Preprocedure Evaluation (Signed)
Anesthesia Evaluation  Patient identified by MRN, date of birth, ID band Patient awake    Reviewed: Allergy & Precautions, NPO status , Patient's Chart, lab work & pertinent test results  Airway Mallampati: II  TM Distance: >3 FB Neck ROM: Full    Dental no notable dental hx. (+) Teeth Intact, Dental Advisory Given   Pulmonary sleep apnea , former smoker,    Pulmonary exam normal breath sounds clear to auscultation       Cardiovascular Exercise Tolerance: Good hypertension, Pt. on medications Normal cardiovascular exam Rhythm:Regular Rate:Normal     Neuro/Psych PSYCHIATRIC DISORDERS Depression    GI/Hepatic Neg liver ROS,   Endo/Other  Hypothyroidism   Renal/GU negative Renal ROS     Musculoskeletal negative musculoskeletal ROS (+)   Abdominal   Peds  Hematology negative hematology ROS (+)   Anesthesia Other Findings   Reproductive/Obstetrics                             Anesthesia Physical Anesthesia Plan  ASA: III  Anesthesia Plan: General   Post-op Pain Management:    Induction:   PONV Risk Score and Plan: 4 or greater and Treatment may vary due to age or medical condition, Ondansetron, Dexamethasone and Midazolam  Airway Management Planned: LMA  Additional Equipment: None  Intra-op Plan:   Post-operative Plan:   Informed Consent: I have reviewed the patients History and Physical, chart, labs and discussed the procedure including the risks, benefits and alternatives for the proposed anesthesia with the patient or authorized representative who has indicated his/her understanding and acceptance.     Dental advisory given  Plan Discussed with: CRNA and Anesthesiologist  Anesthesia Plan Comments:         Anesthesia Quick Evaluation

## 2020-02-13 NOTE — Discharge Instructions (Signed)
[facility]  POST OP INSTRUCTIONS  Always review your discharge instruction sheet given to you by the facility where your surgery was performed. IF YOU HAVE DISABILITY OR FAMILY LEAVE FORMS, YOU MUST BRING THEM TO THE OFFICE FOR PROCESSING.   DO NOT GIVE THEM TO YOUR DOCTOR.  1. A  prescription for pain medication may be given to you upon discharge.  Take your pain medication as prescribed, if needed.  If narcotic pain medicine is not needed, then you may take acetaminophen (Tylenol), naprosyn (Alleve) or ibuprofen (Advil) as needed. 2. Take your usually prescribed medications unless otherwise directed. 3. If you need a refill on your pain medication, please contact your pharmacy.  They will contact our office to request authorization. Prescriptions will not be filled after 5 pm or on week-ends. 4. You should follow a light diet the first 24 hours after arrival home, such as soup and crackers, etc.  Be sure to include lots of fluids daily.  Resume your normal diet the day after surgery. 5. Most patients will experience some swelling and bruising.  Ice packs and reclining will help.  Swelling and bruising can take several days to resolve.  6. It is common to experience some constipation if taking pain medication after surgery.  Increasing fluid intake and taking a stool softener (such as Colace) will usually help or prevent this problem from occurring.  A mild laxative (Milk of Magnesia or Miralax) should be taken according to package directions if there are no bowel movements after 48 hours. 7. Unless discharge instructions indicate otherwise, you may remove your bandages 48 hours after surgery, and you may shower at that time.  You may have steri-strips (small skin tapes) in place directly over the incision.  These strips should be left on the skin for 7-10 days and will come off on their own.  If your surgeon used skin glue on the incision, you may shower in 24 hours.  The glue  will flake off over the next 2-3 weeks.  Any sutures or staples will be removed at the office during your follow-up visit. 8. ACTIVITIES:  You may resume regular (light) daily activities beginning the next day--such as daily self-care, walking, climbing stairs--gradually increasing activities as tolerated.  You may have sexual intercourse when it is comfortable.  Refrain from any heavy lifting or straining until approved by your doctor. a. You may drive when you are no longer taking prescription pain medication, you can comfortably wear a seatbelt, and you can safely maneuver your car and apply brakes. b. RETURN TO WORK:  __________________________________________________________ 9. You should see your doctor in the office for a follow-up appointment approximately 2-3 weeks after your surgery.  Make sure that you call for this appointment within a day or two after you arrive home to insure a convenient appointment time. 10. OTHER INSTRUCTIONS:  __________________________________________________________________________________________________________________________________________________________________________________________  WHEN TO CALL YOUR DOCTOR: 1. Fever over 101.0 2. Inability to urinate 3. Nausea and/or vomiting 4. Extreme swelling or bruising 5. Continued bleeding from incision. 6. Increased pain, redness, or drainage from the incision  The clinic staff is available to answer your questions during regular business hours.  Please don't hesitate to call and ask to speak to one of the nurses for clinical concerns.  If you have a medical emergency, go to the nearest emergency room or call 911.  A surgeon from [facility] is always on call at the hospital   [address], [employer]  [address] [place], [place]  [address] ?  [address] [address], [place], [place]   [address] [phone] ? [phone] ? FAX [phone] Web site: [email or web address]   No Tylenol before  4:00pm if needed No ibuprofen before 5:00pm if needed   Post Anesthesia Home Care Instructions  Activity: Get plenty of rest for the remainder of the day. A responsible individual must stay with you for 24 hours following the procedure.  For the next 24 hours, DO NOT: -Drive a car -Advertising copywriter -Drink alcoholic beverages -Take any medication unless instructed by your physician -Make any legal decisions or sign important papers.  Meals: Start with liquid foods such as gelatin or soup. Progress to regular foods as tolerated. Avoid greasy, spicy, heavy foods. If nausea and/or vomiting occur, drink only clear liquids until the nausea and/or vomiting subsides. Call your physician if vomiting continues.  Special Instructions/Symptoms: Your throat may feel dry or sore from the anesthesia or the breathing tube placed in your throat during surgery. If this causes discomfort, gargle with warm salt water. The discomfort should disappear within 24 hours.  If you had a scopolamine patch placed behind your ear for the management of post- operative nausea and/or vomiting:  1. The medication in the patch is effective for 72 hours, after which it should be removed.  Wrap patch in a tissue and discard in the trash. Wash hands thoroughly with soap and water. 2. You may remove the patch earlier than 72 hours if you experience unpleasant side effects which may include dry mouth, dizziness or visual disturbances. 3. Avoid touching the patch. Wash your hands with soap and water after contact with the patch.

## 2020-02-13 NOTE — H&P (Signed)
  58 yof who has left buttock mass present for 3-4 years that is increasing in size and is now bothering her. no history infection. she is here to discuss options  Past Surgical History  Foot Surgery  Right. Hysterectomy (not due to cancer) - Partial   Allergies Sulfur  Allergies Reconciled   Medication History Levothyroxine Sodium ( Tablet, Oral) Active. Losartan Potassium (25MG  Tablet, Oral) Active. Ezetimibe (10MG  Tablet, Oral) Active. Escitalopram Oxalate (10MG  Tablet, Oral) Active. Loratadine (10MG  Tablet, Oral) Active. Medications Reconciled  Social History  Alcohol use  Moderate alcohol use. Tobacco use  Former smoker.  Family History  Cancer  Mother. Hypertension  Father. Respiratory Condition  Father. Seizure disorder  Son.  Other Problems Hypercholesterolemia    Review of Systems  Gastrointestinal Not Present- Abdominal Pain, Bloating, Bloody Stool, Change in Bowel Habits, Chronic diarrhea, Constipation, Difficulty Swallowing, Excessive gas, Gets full quickly at meals, Hemorrhoids, Indigestion, Nausea, Rectal Pain and Vomiting. Endocrine Present- Hair Changes. Not Present- Cold Intolerance, Excessive Hunger, Heat Intolerance, Hot flashes and New Diabetes.    Physical Exam MD; 12/28/2019 2:48 PM) Female Genitourinary Note: 5x8 cm exophytic mass in left inner thigh / buttock, does not involve vagina or anorectal region no infection, soft mobile   Assessment & Plan  LIPOMA OF BUTTOCK (D17.1) Story: excision of buttock mass I think this is bothering her enough and with growth that it should be excised. is not a typical lipoma. discussed excision under general as outpatient, risks recovery both discussed

## 2020-02-14 ENCOUNTER — Encounter (HOSPITAL_BASED_OUTPATIENT_CLINIC_OR_DEPARTMENT_OTHER): Payer: Self-pay | Admitting: General Surgery

## 2020-02-14 LAB — SURGICAL PATHOLOGY

## 2020-12-09 DIAGNOSIS — L821 Other seborrheic keratosis: Secondary | ICD-10-CM | POA: Diagnosis not present

## 2020-12-09 DIAGNOSIS — L820 Inflamed seborrheic keratosis: Secondary | ICD-10-CM | POA: Diagnosis not present

## 2020-12-09 DIAGNOSIS — Z85828 Personal history of other malignant neoplasm of skin: Secondary | ICD-10-CM | POA: Diagnosis not present

## 2020-12-09 DIAGNOSIS — D2261 Melanocytic nevi of right upper limb, including shoulder: Secondary | ICD-10-CM | POA: Diagnosis not present

## 2021-04-01 DIAGNOSIS — Z1231 Encounter for screening mammogram for malignant neoplasm of breast: Secondary | ICD-10-CM | POA: Diagnosis not present

## 2021-04-21 DIAGNOSIS — H02834 Dermatochalasis of left upper eyelid: Secondary | ICD-10-CM | POA: Diagnosis not present

## 2021-04-21 DIAGNOSIS — H47321 Drusen of optic disc, right eye: Secondary | ICD-10-CM | POA: Diagnosis not present

## 2021-04-21 DIAGNOSIS — H04123 Dry eye syndrome of bilateral lacrimal glands: Secondary | ICD-10-CM | POA: Diagnosis not present

## 2021-04-21 DIAGNOSIS — H02831 Dermatochalasis of right upper eyelid: Secondary | ICD-10-CM | POA: Diagnosis not present

## 2021-05-01 DIAGNOSIS — W19XXXA Unspecified fall, initial encounter: Secondary | ICD-10-CM | POA: Diagnosis not present

## 2021-05-01 DIAGNOSIS — S4991XA Unspecified injury of right shoulder and upper arm, initial encounter: Secondary | ICD-10-CM | POA: Diagnosis not present

## 2021-05-01 DIAGNOSIS — S022XXA Fracture of nasal bones, initial encounter for closed fracture: Secondary | ICD-10-CM | POA: Diagnosis not present

## 2021-05-01 DIAGNOSIS — S0990XA Unspecified injury of head, initial encounter: Secondary | ICD-10-CM | POA: Diagnosis not present

## 2021-05-07 DIAGNOSIS — N3000 Acute cystitis without hematuria: Secondary | ICD-10-CM | POA: Diagnosis not present

## 2021-05-07 DIAGNOSIS — E785 Hyperlipidemia, unspecified: Secondary | ICD-10-CM | POA: Diagnosis not present

## 2021-05-07 DIAGNOSIS — S022XXD Fracture of nasal bones, subsequent encounter for fracture with routine healing: Secondary | ICD-10-CM | POA: Diagnosis not present

## 2021-05-07 DIAGNOSIS — Z0000 Encounter for general adult medical examination without abnormal findings: Secondary | ICD-10-CM | POA: Diagnosis not present

## 2021-05-07 DIAGNOSIS — Z79899 Other long term (current) drug therapy: Secondary | ICD-10-CM | POA: Diagnosis not present

## 2021-05-07 DIAGNOSIS — E039 Hypothyroidism, unspecified: Secondary | ICD-10-CM | POA: Diagnosis not present

## 2021-05-07 DIAGNOSIS — E559 Vitamin D deficiency, unspecified: Secondary | ICD-10-CM | POA: Diagnosis not present

## 2021-05-14 ENCOUNTER — Other Ambulatory Visit (HOSPITAL_BASED_OUTPATIENT_CLINIC_OR_DEPARTMENT_OTHER): Payer: Self-pay | Admitting: Family Medicine

## 2021-05-14 DIAGNOSIS — E785 Hyperlipidemia, unspecified: Secondary | ICD-10-CM

## 2021-05-27 ENCOUNTER — Ambulatory Visit (HOSPITAL_BASED_OUTPATIENT_CLINIC_OR_DEPARTMENT_OTHER): Payer: 59

## 2021-07-01 ENCOUNTER — Ambulatory Visit (HOSPITAL_BASED_OUTPATIENT_CLINIC_OR_DEPARTMENT_OTHER)
Admission: RE | Admit: 2021-07-01 | Discharge: 2021-07-01 | Disposition: A | Discharge status: Home / Self Care | Payer: Self-pay | Source: Ambulatory Visit | Attending: Family Medicine | Admitting: Family Medicine

## 2021-07-01 ENCOUNTER — Encounter (HOSPITAL_BASED_OUTPATIENT_CLINIC_OR_DEPARTMENT_OTHER): Payer: Self-pay

## 2021-07-01 DIAGNOSIS — E785 Hyperlipidemia, unspecified: Secondary | ICD-10-CM | POA: Insufficient documentation

## 2021-07-18 ENCOUNTER — Other Ambulatory Visit (HOSPITAL_BASED_OUTPATIENT_CLINIC_OR_DEPARTMENT_OTHER): Payer: Self-pay | Admitting: Family Medicine

## 2021-07-18 DIAGNOSIS — E039 Hypothyroidism, unspecified: Secondary | ICD-10-CM | POA: Diagnosis not present

## 2021-07-18 DIAGNOSIS — R1011 Right upper quadrant pain: Secondary | ICD-10-CM | POA: Diagnosis not present

## 2021-07-18 DIAGNOSIS — R945 Abnormal results of liver function studies: Secondary | ICD-10-CM | POA: Diagnosis not present

## 2021-07-18 DIAGNOSIS — E785 Hyperlipidemia, unspecified: Secondary | ICD-10-CM | POA: Diagnosis not present

## 2021-07-18 DIAGNOSIS — D72829 Elevated white blood cell count, unspecified: Secondary | ICD-10-CM | POA: Diagnosis not present

## 2021-07-19 ENCOUNTER — Observation Stay (HOSPITAL_BASED_OUTPATIENT_CLINIC_OR_DEPARTMENT_OTHER)
Admission: EM | Admit: 2021-07-19 | Discharge: 2021-07-21 | Disposition: A | Discharge status: Home / Self Care | Payer: Medicare Other | Attending: General Surgery | Admitting: General Surgery

## 2021-07-19 ENCOUNTER — Ambulatory Visit (HOSPITAL_BASED_OUTPATIENT_CLINIC_OR_DEPARTMENT_OTHER)
Admission: RE | Admit: 2021-07-19 | Discharge: 2021-07-19 | Disposition: A | Discharge status: Home / Self Care | Payer: Medicare Other | Source: Ambulatory Visit | Attending: Family Medicine | Admitting: Family Medicine

## 2021-07-19 ENCOUNTER — Other Ambulatory Visit: Payer: Self-pay

## 2021-07-19 ENCOUNTER — Encounter (HOSPITAL_BASED_OUTPATIENT_CLINIC_OR_DEPARTMENT_OTHER): Payer: Self-pay

## 2021-07-19 DIAGNOSIS — Z79899 Other long term (current) drug therapy: Secondary | ICD-10-CM | POA: Diagnosis not present

## 2021-07-19 DIAGNOSIS — R1013 Epigastric pain: Secondary | ICD-10-CM | POA: Diagnosis present

## 2021-07-19 DIAGNOSIS — K8000 Calculus of gallbladder with acute cholecystitis without obstruction: Principal | ICD-10-CM

## 2021-07-19 DIAGNOSIS — Z87891 Personal history of nicotine dependence: Secondary | ICD-10-CM | POA: Insufficient documentation

## 2021-07-19 DIAGNOSIS — N2889 Other specified disorders of kidney and ureter: Secondary | ICD-10-CM | POA: Insufficient documentation

## 2021-07-19 DIAGNOSIS — Z91040 Latex allergy status: Secondary | ICD-10-CM | POA: Diagnosis not present

## 2021-07-19 DIAGNOSIS — K810 Acute cholecystitis: Secondary | ICD-10-CM

## 2021-07-19 DIAGNOSIS — I10 Essential (primary) hypertension: Secondary | ICD-10-CM | POA: Insufficient documentation

## 2021-07-19 DIAGNOSIS — K8020 Calculus of gallbladder without cholecystitis without obstruction: Secondary | ICD-10-CM | POA: Diagnosis not present

## 2021-07-19 DIAGNOSIS — K8012 Calculus of gallbladder with acute and chronic cholecystitis without obstruction: Secondary | ICD-10-CM | POA: Diagnosis not present

## 2021-07-19 DIAGNOSIS — E039 Hypothyroidism, unspecified: Secondary | ICD-10-CM | POA: Insufficient documentation

## 2021-07-19 DIAGNOSIS — K8010 Calculus of gallbladder with chronic cholecystitis without obstruction: Secondary | ICD-10-CM | POA: Diagnosis not present

## 2021-07-19 DIAGNOSIS — R1011 Right upper quadrant pain: Secondary | ICD-10-CM

## 2021-07-19 DIAGNOSIS — R509 Fever, unspecified: Secondary | ICD-10-CM | POA: Diagnosis not present

## 2021-07-19 LAB — URINALYSIS, ROUTINE W REFLEX MICROSCOPIC
Bilirubin Urine: NEGATIVE
Glucose, UA: NEGATIVE mg/dL
Hgb urine dipstick: NEGATIVE
Leukocytes,Ua: NEGATIVE
Nitrite: NEGATIVE
Protein, ur: NEGATIVE mg/dL
Specific Gravity, Urine: 1.011 (ref 1.005–1.030)
pH: 6.5 (ref 5.0–8.0)

## 2021-07-19 LAB — CBC WITH DIFFERENTIAL/PLATELET
Abs Immature Granulocytes: 0.02 10*3/uL (ref 0.00–0.07)
Basophils Absolute: 0 10*3/uL (ref 0.0–0.1)
Basophils Relative: 1 %
Eosinophils Absolute: 0.3 10*3/uL (ref 0.0–0.5)
Eosinophils Relative: 4 %
HCT: 41 % (ref 36.0–46.0)
Hemoglobin: 13.5 g/dL (ref 12.0–15.0)
Immature Granulocytes: 0 %
Lymphocytes Relative: 25 %
Lymphs Abs: 1.7 10*3/uL (ref 0.7–4.0)
MCH: 31.8 pg (ref 26.0–34.0)
MCHC: 32.9 g/dL (ref 30.0–36.0)
MCV: 96.5 fL (ref 80.0–100.0)
Monocytes Absolute: 0.6 10*3/uL (ref 0.1–1.0)
Monocytes Relative: 9 %
Neutro Abs: 4.3 10*3/uL (ref 1.7–7.7)
Neutrophils Relative %: 61 %
Platelets: 253 10*3/uL (ref 150–400)
RBC: 4.25 MIL/uL (ref 3.87–5.11)
RDW: 12.7 % (ref 11.5–15.5)
WBC: 6.9 10*3/uL (ref 4.0–10.5)
nRBC: 0 % (ref 0.0–0.2)

## 2021-07-19 LAB — COMPREHENSIVE METABOLIC PANEL
ALT: 23 U/L (ref 0–44)
AST: 15 U/L (ref 15–41)
Albumin: 3.9 g/dL (ref 3.5–5.0)
Alkaline Phosphatase: 99 U/L (ref 38–126)
Anion gap: 10 (ref 5–15)
BUN: 12 mg/dL (ref 8–23)
CO2: 27 mmol/L (ref 22–32)
Calcium: 9.4 mg/dL (ref 8.9–10.3)
Chloride: 104 mmol/L (ref 98–111)
Creatinine, Ser: 0.63 mg/dL (ref 0.44–1.00)
GFR, Estimated: >60 mL/min (ref >60)
Glucose, Bld: 85 mg/dL (ref 70–99)
Potassium: 3.7 mmol/L (ref 3.5–5.1)
Sodium: 141 mmol/L (ref 135–145)
Total Bilirubin: 0.6 mg/dL (ref 0.3–1.2)
Total Protein: 7 g/dL (ref 6.5–8.1)

## 2021-07-19 LAB — PROTIME-INR
INR: 1 (ref 0.8–1.2)
Prothrombin Time: 12.7 seconds (ref 11.4–15.2)

## 2021-07-19 LAB — LIPASE, BLOOD: Lipase: 27 U/L (ref 11–51)

## 2021-07-19 LAB — TROPONIN I (HIGH SENSITIVITY): Troponin I (High Sensitivity): 4 ng/L (ref <18)

## 2021-07-19 MED ORDER — OXYCODONE HCL 5 MG PO TABS: 10.0000 mg | ORAL_TABLET | ORAL | Status: DC | PRN

## 2021-07-19 MED ORDER — ENOXAPARIN SODIUM 40 MG/0.4ML IJ SOSY
40.0000 mg | PREFILLED_SYRINGE | INTRAMUSCULAR | Status: DC
  Administered 2021-07-19: 40 mg via SUBCUTANEOUS
  Filled 2021-07-19: qty 0.4

## 2021-07-19 MED ORDER — ACETAMINOPHEN 325 MG PO TABS
650.0000 mg | ORAL_TABLET | Freq: Four times a day (QID) | ORAL | Status: DC
  Administered 2021-07-19 – 2021-07-21 (×6): 650 mg via ORAL
  Filled 2021-07-19 (×6): qty 2

## 2021-07-19 MED ORDER — OXYCODONE HCL 5 MG PO TABS: 5.0000 mg | ORAL_TABLET | ORAL | Status: DC | PRN

## 2021-07-19 MED ORDER — PROCHLORPERAZINE EDISYLATE 10 MG/2ML IJ SOLN
10.0000 mg | INTRAMUSCULAR | Status: DC | PRN
  Administered 2021-07-20 (×2): 10 mg via INTRAVENOUS
  Filled 2021-07-19 (×2): qty 2

## 2021-07-19 MED ORDER — DOCUSATE SODIUM 100 MG PO CAPS
100.0000 mg | ORAL_CAPSULE | Freq: Two times a day (BID) | ORAL | Status: DC
  Administered 2021-07-19 – 2021-07-21 (×3): 100 mg via ORAL
  Filled 2021-07-19 (×3): qty 1

## 2021-07-19 MED ORDER — LACTATED RINGERS IV SOLN: INTRAVENOUS | Status: DC

## 2021-07-19 MED ORDER — ONDANSETRON HCL 4 MG/2ML IJ SOLN: 4.0000 mg | Freq: Four times a day (QID) | INTRAMUSCULAR | Status: DC | PRN

## 2021-07-19 MED ORDER — SODIUM CHLORIDE 0.9 % IV BOLUS
1000.0000 mL | Freq: Once | INTRAVENOUS | Status: AC
  Administered 2021-07-19: 1000 mL via INTRAVENOUS

## 2021-07-19 MED ORDER — HYDROMORPHONE HCL 1 MG/ML IJ SOLN: 0.5000 mg | INTRAMUSCULAR | Status: DC | PRN

## 2021-07-19 MED ORDER — SODIUM CHLORIDE 0.9 % IV SOLN
2.0000 g | Freq: Once | INTRAVENOUS | Status: AC
  Administered 2021-07-19: 2 g via INTRAVENOUS
  Filled 2021-07-19: qty 20

## 2021-07-19 MED ORDER — GABAPENTIN 300 MG PO CAPS
300.0000 mg | ORAL_CAPSULE | Freq: Three times a day (TID) | ORAL | Status: DC
  Administered 2021-07-19: 300 mg via ORAL
  Filled 2021-07-19: qty 1

## 2021-07-19 MED ORDER — SODIUM CHLORIDE 0.9 % IV SOLN: 2.0000 g | INTRAVENOUS | Status: DC

## 2021-07-19 MED ORDER — METHOCARBAMOL 1000 MG/10ML IJ SOLN
500.0000 mg | Freq: Four times a day (QID) | INTRAVENOUS | Status: DC | PRN
  Filled 2021-07-19: qty 5

## 2021-07-19 MED ORDER — SIMETHICONE 80 MG PO CHEW
80.0000 mg | CHEWABLE_TABLET | Freq: Four times a day (QID) | ORAL | Status: DC | PRN
  Filled 2021-07-19: qty 1

## 2021-07-19 NOTE — ED Triage Notes (Signed)
Pt c/o upper abdominal pain, nausea, reflux, low grade fever, and decreased appetite since Sunday. Pt had an [place] today and was told she had Cholecystitis.

## 2021-07-19 NOTE — H&P (Signed)
Admitting Physician: Nickola Major Juliett Eastburn  Service: General Surgery  CC: Abdominal pain  Subjective   HPI: Cassidy Terry is an 66 y.o. female who is here for abdominal pain.  It has been for about the last 10 days.  She had elevation in her LFTs as an outpatient and was imaged and sent to the ER by her primary care physician.  She has been having pain in the right upper quadrant after eating off and on for a few months.  It was bad over the last ten days.  She has basically avoided eating.  Past Medical History:  Diagnosis Date   Depression    Hypertension    Hypothyroidism    Psoriasis    Sleep apnea    Mild without the use of a cpap    Past Surgical History:  Procedure Laterality Date   ABDOMINAL HYSTERECTOMY     TAH FOR HEMATOMETRIA   BELPHAROPTOSIS REPAIR     Bilateral   CARPAL TUNNEL RELEASE Left 02/24/2018   Procedure: LEFT CARPAL TUNNEL RELEASE;  Surgeon: Daryll Brod, MD;  Location: [facility];  Service: Orthopedics;  Laterality: Left;   CESAREAN SECTION     X2   DILATION AND CURETTAGE OF UTERUS     X2   ENDOMETRIAL ABLATION     ENDOMYORESECTION   FOOT SURGERY  10/2010   rt. foot, 2nd toe, 1 pin to stay in   [place] Left 02/13/2020   Procedure: EXCISION SUBCUTANEOUS BUTTOCK LIPOMA;  Surgeon: Rolm Bookbinder, MD;  Location: [facility];  Service: General;  Laterality: Left;  GEN AND LMA   TONSILLECTOMY     TUBAL LIGATION     LTL-FULGURATION/TRANSECTION    Family History  Problem Relation Age of Onset   Hypertension Father    Vaginal cancer Mother     Social:  reports that she has quit smoking. She has never used smokeless tobacco. She reports current alcohol use. She reports that she does not use drugs.  Allergies:  Allergies  Allergen Reactions   Latex     Fever blisters   Sulfa Antibiotics     Unknown reaction    Medications: Current Outpatient Medications  Medication Instructions   cetirizine (ZYRTEC) 10  mg, Oral, Daily   escitalopram (LEXAPRO) 10 mg, Oral, Daily   ezetimibe (ZETIA) 10 mg, Oral, Daily   levothyroxine (SYNTHROID) 25 mcg, Oral, Daily before breakfast   losartan (COZAAR) 25 mg, Oral, Daily   traMADol (ULTRAM) 50 mg, Oral, Every 6 hours PRN    ROS - all of the below systems have been reviewed with the patient and positives are indicated with bold text General: chills, fever or night sweats Eyes: blurry vision or double vision ENT: epistaxis or sore throat Allergy/Immunology: itchy/watery eyes or nasal congestion Hematologic/Lymphatic: bleeding problems, blood clots or swollen lymph nodes Endocrine: temperature intolerance or unexpected weight changes Breast: new or changing breast lumps or nipple discharge Resp: cough, shortness of breath, or wheezing CV: chest pain or dyspnea on exertion GI: as per HPI GU: dysuria, trouble voiding, or hematuria MSK: joint pain or joint stiffness Neuro: TIA or stroke symptoms Derm: pruritus and skin lesion changes Psych: anxiety and depression  Objective   PE Blood pressure (!) 158/80, pulse 61, temperature 98.3 F (36.8 C), temperature source Oral, resp. rate 15, height '5\' 7"'$  (1.702 m), weight 77.1 kg, last menstrual period 11/06/2002, SpO2 96 %. Constitutional: NAD; conversant; no deformities Eyes: Moist conjunctiva; no lid lag;  anicteric; PERRL Neck: Trachea midline; no thyromegaly Lungs: Normal respiratory effort; no tactile fremitus CV: RRR; no palpable thrills; no pitting edema GI: Abd Soft, tender RUQ; no palpable hepatosplenomegaly MSK: Normal range of motion of extremities; no clubbing/cyanosis Psychiatric: Appropriate affect; alert and oriented x3 Lymphatic: No palpable cervical or axillary lymphadenopathy  Results for orders placed or performed during the hospital encounter of 07/19/21 (from the past 24 hour(s))  Comprehensive metabolic panel     Status: None   Collection Time: 07/19/21  3:53 PM  Result Value Ref  Range   Sodium 141 135 - 145 mmol/L   Potassium 3.7 3.5 - 5.1 mmol/L   Chloride 104 98 - 111 mmol/L   CO2 27 22 - 32 mmol/L   Glucose, Bld 85 70 - 99 mg/dL   BUN 12 8 - 23 mg/dL   Creatinine, Ser 0.63 0.44 - 1.00 mg/dL   Calcium 9.4 8.9 - 10.3 mg/dL   Total Protein 7.0 6.5 - 8.1 g/dL   Albumin 3.9 3.5 - 5.0 g/dL   AST 15 15 - 41 U/L   ALT 23 0 - 44 U/L   Alkaline Phosphatase 99 38 - 126 U/L   Total Bilirubin 0.6 0.3 - 1.2 mg/dL   GFR, Estimated >60 >60 mL/min   Anion gap 10 5 - 15  Lipase, blood     Status: None   Collection Time: 07/19/21  3:53 PM  Result Value Ref Range   Lipase 27 11 - 51 U/L  CBC with Differential     Status: None   Collection Time: 07/19/21  3:53 PM  Result Value Ref Range   WBC 6.9 4.0 - 10.5 K/uL   RBC 4.25 3.87 - 5.11 MIL/uL   Hemoglobin 13.5 12.0 - 15.0 g/dL   HCT 41.0 36.0 - 46.0 %   MCV 96.5 80.0 - 100.0 fL   MCH 31.8 26.0 - 34.0 pg   MCHC 32.9 30.0 - 36.0 g/dL   RDW 12.7 11.5 - 15.5 %   Platelets 253 150 - 400 K/uL   nRBC 0.0 0.0 - 0.2 %   Neutrophils Relative % 61 %   Neutro Abs 4.3 1.7 - 7.7 K/uL   Lymphocytes Relative 25 %   Lymphs Abs 1.7 0.7 - 4.0 K/uL   Monocytes Relative 9 %   Monocytes Absolute 0.6 0.1 - 1.0 K/uL   Eosinophils Relative 4 %   Eosinophils Absolute 0.3 0.0 - 0.5 K/uL   Basophils Relative 1 %   Basophils Absolute 0.0 0.0 - 0.1 K/uL   Immature Granulocytes 0 %   Abs Immature Granulocytes 0.02 0.00 - 0.07 K/uL  Troponin I (High Sensitivity)     Status: None   Collection Time: 07/19/21  3:53 PM  Result Value Ref Range   Troponin I (High Sensitivity) 4 <18 ng/L  Protime-INR     Status: None   Collection Time: 07/19/21  3:53 PM  Result Value Ref Range   Prothrombin Time 12.7 11.4 - 15.2 seconds   INR 1.0 0.8 - 1.2    Imaging Orders  No imaging studies ordered today   1. Cholelithiasis with gallbladder wall thickening (51m), pericholecystic fluid, and positive sonographic Murphy sign suggesting cholecystitis. 2.  3.5 cm right upper pole renal mass. Recommend elective outpatient MR kidney without and with contrast for further characterization.  CBD 448m no intrahepatic biliary ductal dilation     Assessment and Plan   Cassidy WELTMANs an [age].o. female with acute cholecystitis.  I recommended laparoscopic cholecystectomy with possible intra-operative cholangiogram.  The procedure itself as well as its risks, benefits and alternatives were discussed and the patient granted consent to proceed.  The current plan will be to proceed to the OR with Dr. [name] in the morning.   No diagnosis found.   Felicie Morn, MD  [facility] Use [email or web address] to contact on call provider  New Patient Billing: [phone] - High MDM

## 2021-07-19 NOTE — ED Provider Notes (Signed)
[facility] EMERGENCY DEPT Provider Note   CSN: [id number] Arrival date & time: 07/19/21  1417     History  Chief Complaint  Patient presents with   Abdominal Pain    Cassidy Terry is a 66 y.o. female.  She has no significant past medical history.  She has had about a week and a half of abdominal discomfort radiating into her chest under her breast into back, nausea and vomiting, low grade fevers, anorexia.  Saw PCP on Friday who ordered lab work and had an ultrasound outpatient today.  Recommendation was come to the ED for evaluation by surgery.  Past surgical history of hysterectomy.  The history is provided by the patient.  Abdominal Pain Pain location:  Epigastric Pain quality: pressure   Pain radiates to:  Chest and back Pain severity:  Moderate Onset quality:  Gradual Duration:  10 days Timing:  Intermittent Progression:  Unchanged Chronicity:  New Context: not sick contacts and not trauma   Relieved by:  Nothing Worsened by:  Nothing Ineffective treatments:  Antacids Associated symptoms: anorexia, chest pain, fatigue, fever, nausea and vomiting   Associated symptoms: no cough, no diarrhea, no dysuria, no shortness of breath and no sore throat        Home Medications Prior to Admission medications   Medication Sig Start Date End Date Taking? Authorizing Provider  cetirizine (ZYRTEC) 10 MG tablet Take 10 mg by mouth daily.    [provider]  escitalopram (LEXAPRO) 10 MG tablet Take 10 mg by mouth daily. 11/10/13   [provider]  ezetimibe (ZETIA) 10 MG tablet Take 10 mg by mouth daily.    [provider]  levothyroxine (SYNTHROID) 25 MCG tablet Take 25 mcg by mouth daily before breakfast.    [provider]  losartan (COZAAR) 25 MG tablet Take 25 mg by mouth daily.    [provider]  traMADol (ULTRAM) 50 MG tablet Take 1 tablet (50 mg total) by mouth every 6 (six) hours as needed. 02/13/20   Rolm Bookbinder, MD      Allergies    Latex and Sulfa antibiotics    Review of Systems   Review of Systems  Constitutional:  Positive for appetite change, fatigue and fever.  HENT:  Negative for sore throat.   Eyes:  Negative for visual disturbance.  Respiratory:  Negative for cough and shortness of breath.   Cardiovascular:  Positive for chest pain.  Gastrointestinal:  Positive for abdominal pain, anorexia, nausea and vomiting. Negative for diarrhea.  Genitourinary:  Negative for dysuria.  Musculoskeletal:  Positive for back pain.  Skin:  Negative for rash.  Neurological:  Negative for headaches.    Physical Exam Updated Vital Signs BP (!) 149/71   Pulse 61   Temp 98.3 F (36.8 C) (Oral)   Resp 17   Ht '5\' 7"'$  (1.702 m)   Wt 77.1 kg   LMP 11/06/2002   SpO2 99%   BMI 26.63 kg/m  Physical Exam Vitals and nursing note reviewed.  Constitutional:      General: She is not in acute distress.    Appearance: She is well-developed.  HENT:     Head: Normocephalic and atraumatic.  Eyes:     Conjunctiva/sclera: Conjunctivae normal.  Cardiovascular:     Rate and Rhythm: Normal rate and regular rhythm.     Heart sounds: No murmur heard. Pulmonary:     Effort: Pulmonary effort is normal. No respiratory distress.     Breath  sounds: Normal breath sounds.  Abdominal:     Palpations: Abdomen is soft.     Tenderness: There is no abdominal tenderness. There is no guarding or rebound. Negative signs include Murphy's sign.  Musculoskeletal:        General: No swelling.     Cervical back: Neck supple.  Skin:    General: Skin is warm and dry.     Capillary Refill: Capillary refill takes less than 2 seconds.  Neurological:     General: No focal deficit present.     Mental Status: She is alert.     ED Results / Procedures / Treatments   Labs (all labs ordered are listed, but only abnormal results are displayed) Labs Reviewed  SURGICAL PCR SCREEN - Abnormal; Notable for the following  components:      Result Value   Staphylococcus aureus POSITIVE (*)    All other components within normal limits  URINALYSIS, ROUTINE W REFLEX MICROSCOPIC - Abnormal; Notable for the following components:   Ketones, ur TRACE (*)    All other components within normal limits  BASIC METABOLIC PANEL - Abnormal; Notable for the following components:   Calcium 8.5 (*)    All other components within normal limits  HEPATIC FUNCTION PANEL - Abnormal; Notable for the following components:   Total Protein 5.8 (*)    Albumin 2.9 (*)    All other components within normal limits  COMPREHENSIVE METABOLIC PANEL  LIPASE, BLOOD  CBC WITH DIFFERENTIAL/PLATELET  PROTIME-INR  HIV ANTIBODY (ROUTINE TESTING W REFLEX)  CBC  TROPONIN I (HIGH SENSITIVITY)    EKG EKG Interpretation  Date/Time:  Saturday July 19 2021 15:31:29 EDT Ventricular Rate:  57 PR Interval:  146 QRS Duration: 85 QT Interval:  450 QTC Calculation: 439 R Axis:   28 Text Interpretation: Sinus rhythm Low voltage, precordial leads No significant change since prior 12/21 Confirmed by Aletta Edouard [phone]) on 07/19/2021 3:38:42 PM  Radiology US Abdomen Limited RUQ (LIVER/GB)  Result Date: 07/19/2021 CLINICAL DATA:  Right upper quadrant pain, fever EXAM: ULTRASOUND ABDOMEN LIMITED RIGHT UPPER QUADRANT COMPARISON:  None FINDINGS: Gallbladder: Wall thickening up to 4 mm. Mixed attenuation sludge ball in the fundus with some echogenic foci probably small stones measured up to 5 mm. There is a small amount of pericholecystic fluid. Sonographer does report sonographic Murphy's sign. Common bile duct: Diameter: 4 mm. No intrahepatic biliary ductal dilatation identified. Liver: No focal lesion identified. Within normal limits in parenchymal echogenicity. Portal vein is patent on color Doppler imaging with normal direction of blood flow towards the liver. Other: 3.4 x 3.5 x 2.9 cm partially exophytic solid-appearing mass from the upper pole right  kidney, with some internal flow signal. IMPRESSION: 1. Cholelithiasis with gallbladder wall thickening, pericholecystic fluid, and positive sonographic Murphy sign suggesting cholecystitis. 2. 3.5 cm right upper pole renal mass. Recommend elective outpatient MR kidney without and with contrast for further characterization. Electronically Signed   By: Lucrezia Europe M.D.   On: 07/19/2021 13:45    Procedures Procedures    Medications Ordered in ED Medications  sodium chloride 0.9 % bolus 1,000 mL (has no administration in time range)    ED Course/ Medical Decision Making/ A&P Clinical Course as of 07/20/21 0907  Sat Jul 19, 2021  1635 FINDINGS: Gallbladder:   Wall thickening up to 4 mm. Mixed attenuation sludge ball in the fundus with some echogenic foci probably small stones measured up to 5 mm. There is a small amount of pericholecystic  fluid. Sonographer does report sonographic Murphy's sign.   Common bile duct:   Diameter: 4 mm. No intrahepatic biliary ductal dilatation identified.   Liver:   No focal lesion identified. Within normal limits in parenchymal echogenicity. Portal vein is patent on color Doppler imaging with normal direction of blood flow towards the liver.   Other: 3.4 x 3.5 x 2.9 cm partially exophytic solid-appearing mass from the upper pole right kidney, with some internal flow signal.   IMPRESSION: 1. Cholelithiasis with gallbladder wall thickening, pericholecystic fluid, and positive sonographic Murphy sign suggesting cholecystitis. 2. 3.5 cm right upper pole renal mass. Recommend elective outpatient MR kidney without and with contrast for further characterization.   [MB]  1800 Second page out to general surgery Dr. [name].  Initial page at 5 PM [MB]  1910 Discussed with general surgery Dr. Thermon Leyland who will accept the patient to his service.  He did agree with antibiotics. [MB]    Clinical Course User Index [MB] Hayden Rasmussen, MD                            Medical Decision Making Amount and/or Complexity of Data Reviewed Labs: ordered.  Risk Decision regarding hospitalization.  This patient complains of upper abdominal pain nausea fever; this involves an extensive number of treatment Options and is a complaint that carries with it a high risk of complications and morbidity. The differential includes cholecystitis, cholelithiasis, peptic ulcer disease, gastritis, a GE colitis, diverticulitis, perforation  I ordered, reviewed and interpreted labs, which included CBC normal, chemistries and LFTs normal, urinalysis negative INR normal, troponin unremarkable I ordered medication IV fluids IV antibiotics and reviewed PMP when indicated. Patient had an outpatient ultrasound ordered by her PCP I independently    visualized and interpreted imaging which showed cholelithiasis with some wall thickening and pericholecystic fluid Additional history obtained from patient's husband Previous records obtained and reviewed in epic including prior PCP visits I consulted general surgery Dr. Josem Kaufmann and discussed lab and imaging findings and discussed disposition.  Cardiac monitoring reviewed, normal sinus rhythm Social determinants considered, no significant barriers Critical Interventions: None  After the interventions stated above, I reevaluated the patient and found patient to be fairly asymptomatic here. Admission and further testing considered, she will need transfer and admission to general surgery for anticipated cholecystectomy tomorrow          Final Clinical Impression(s) / ED Diagnoses Final diagnoses:  Calculus of gallbladder with acute cholecystitis without obstruction    Rx / DC Orders ED Discharge Orders     None         Hayden Rasmussen, MD 07/20/21 [phone]

## 2021-07-19 NOTE — ED Notes (Signed)
Patient ambulated to restroom, placed back on monitor. No needs at this time.

## 2021-07-19 NOTE — ED Notes (Signed)
RT educated pt on proper use of Incentive spirometer. Pt goal 1500 ML pt able to perform 2000 ML. Pt able to perform w/out difficulty and verbalizes understanding of teaching and process. Pt education pre surgical.

## 2021-07-20 ENCOUNTER — Inpatient Hospital Stay (HOSPITAL_COMMUNITY): Payer: Medicare Other | Admitting: Anesthesiology

## 2021-07-20 ENCOUNTER — Encounter (HOSPITAL_COMMUNITY): Payer: Self-pay

## 2021-07-20 ENCOUNTER — Encounter (HOSPITAL_COMMUNITY)
Admission: EM | Disposition: A | Discharge status: Home / Self Care | Payer: Self-pay | Source: Home / Self Care | Attending: Emergency Medicine

## 2021-07-20 DIAGNOSIS — K8000 Calculus of gallbladder with acute cholecystitis without obstruction: Secondary | ICD-10-CM

## 2021-07-20 DIAGNOSIS — K810 Acute cholecystitis: Secondary | ICD-10-CM | POA: Diagnosis not present

## 2021-07-20 DIAGNOSIS — K811 Chronic cholecystitis: Secondary | ICD-10-CM | POA: Diagnosis not present

## 2021-07-20 HISTORY — PX: CHOLECYSTECTOMY: SHX55

## 2021-07-20 LAB — BASIC METABOLIC PANEL
Anion gap: 7 (ref 5–15)
BUN: 11 mg/dL (ref 8–23)
CO2: 26 mmol/L (ref 22–32)
Calcium: 8.5 mg/dL — ABNORMAL LOW (ref 8.9–10.3)
Chloride: 107 mmol/L (ref 98–111)
Creatinine, Ser: 0.72 mg/dL (ref 0.44–1.00)
GFR, Estimated: >60 mL/min (ref >60)
Glucose, Bld: 88 mg/dL (ref 70–99)
Potassium: 3.5 mmol/L (ref 3.5–5.1)
Sodium: 140 mmol/L (ref 135–145)

## 2021-07-20 LAB — HEPATIC FUNCTION PANEL
ALT: 23 U/L (ref 0–44)
AST: 18 U/L (ref 15–41)
Albumin: 2.9 g/dL — ABNORMAL LOW (ref 3.5–5.0)
Alkaline Phosphatase: 90 U/L (ref 38–126)
Bilirubin, Direct: 0.1 mg/dL (ref 0.0–0.2)
Indirect Bilirubin: 0.5 mg/dL (ref 0.3–0.9)
Total Bilirubin: 0.6 mg/dL (ref 0.3–1.2)
Total Protein: 5.8 g/dL — ABNORMAL LOW (ref 6.5–8.1)

## 2021-07-20 LAB — SURGICAL PCR SCREEN
MRSA, PCR: NEGATIVE
Staphylococcus aureus: POSITIVE — AB

## 2021-07-20 LAB — CBC
HCT: 38.6 % (ref 36.0–46.0)
Hemoglobin: 12.7 g/dL (ref 12.0–15.0)
MCH: 31.9 pg (ref 26.0–34.0)
MCHC: 32.9 g/dL (ref 30.0–36.0)
MCV: 97 fL (ref 80.0–100.0)
Platelets: 244 10*3/uL (ref 150–400)
RBC: 3.98 MIL/uL (ref 3.87–5.11)
RDW: 12.4 % (ref 11.5–15.5)
WBC: 7 10*3/uL (ref 4.0–10.5)
nRBC: 0 % (ref 0.0–0.2)

## 2021-07-20 LAB — HIV ANTIBODY (ROUTINE TESTING W REFLEX): HIV Screen 4th Generation wRfx: NONREACTIVE

## 2021-07-20 SURGERY — LAPAROSCOPIC CHOLECYSTECTOMY WITH INTRAOPERATIVE CHOLANGIOGRAM
Anesthesia: General | Site: Abdomen

## 2021-07-20 MED ORDER — ESCITALOPRAM OXALATE 10 MG PO TABS
10.0000 mg | ORAL_TABLET | Freq: Every day | ORAL | Status: DC
  Administered 2021-07-20 – 2021-07-21 (×2): 10 mg via ORAL
  Filled 2021-07-20 (×2): qty 1

## 2021-07-20 MED ORDER — ROCURONIUM BROMIDE 10 MG/ML (PF) SYRINGE
PREFILLED_SYRINGE | INTRAVENOUS | Status: AC
Start: 2021-07-20 — End: ?
  Filled 2021-07-20: qty 10

## 2021-07-20 MED ORDER — ORAL CARE MOUTH RINSE: 15.0000 mL | Freq: Once | OROMUCOSAL | Status: AC

## 2021-07-20 MED ORDER — PHENYLEPHRINE HCL-NACL 20-0.9 MG/250ML-% IV SOLN
INTRAVENOUS | Status: DC | PRN
  Administered 2021-07-20: 50 ug/min via INTRAVENOUS

## 2021-07-20 MED ORDER — MIDAZOLAM HCL 2 MG/2ML IJ SOLN
INTRAMUSCULAR | Status: DC | PRN
  Administered 2021-07-20: 2 mg via INTRAVENOUS

## 2021-07-20 MED ORDER — PHENYLEPHRINE 80 MCG/ML (10ML) SYRINGE FOR IV PUSH (FOR BLOOD PRESSURE SUPPORT)
PREFILLED_SYRINGE | INTRAVENOUS | Status: AC
  Filled 2021-07-20: qty 10

## 2021-07-20 MED ORDER — ONDANSETRON HCL 4 MG/2ML IJ SOLN
INTRAMUSCULAR | Status: DC | PRN
  Administered 2021-07-20: 4 mg via INTRAVENOUS

## 2021-07-20 MED ORDER — DEXAMETHASONE SODIUM PHOSPHATE 10 MG/ML IJ SOLN
INTRAMUSCULAR | Status: DC | PRN
  Administered 2021-07-20: 5 mg via INTRAVENOUS

## 2021-07-20 MED ORDER — GABAPENTIN 300 MG PO CAPS: 300.0000 mg | ORAL_CAPSULE | Freq: Three times a day (TID) | ORAL | Status: DC | PRN

## 2021-07-20 MED ORDER — MIDAZOLAM HCL 2 MG/2ML IJ SOLN
INTRAMUSCULAR | Status: AC
  Filled 2021-07-20: qty 2

## 2021-07-20 MED ORDER — MIDAZOLAM HCL 2 MG/2ML IJ SOLN: 0.5000 mg | Freq: Once | INTRAMUSCULAR | Status: DC | PRN

## 2021-07-20 MED ORDER — OXYCODONE HCL 5 MG PO TABS: 5.0000 mg | ORAL_TABLET | Freq: Once | ORAL | Status: DC | PRN

## 2021-07-20 MED ORDER — GLYCOPYRROLATE PF 0.2 MG/ML IJ SOSY
PREFILLED_SYRINGE | INTRAMUSCULAR | Status: DC | PRN
  Administered 2021-07-20: .2 mg via INTRAVENOUS

## 2021-07-20 MED ORDER — ONDANSETRON HCL 4 MG/2ML IJ SOLN
INTRAMUSCULAR | Status: AC
Start: 2021-07-20 — End: ?
  Filled 2021-07-20: qty 2

## 2021-07-20 MED ORDER — EPHEDRINE 5 MG/ML INJ
INTRAVENOUS | Status: AC
Start: 2021-07-20 — End: ?
  Filled 2021-07-20: qty 5

## 2021-07-20 MED ORDER — PROPOFOL 10 MG/ML IV BOLUS
INTRAVENOUS | Status: DC | PRN
  Administered 2021-07-20: 120 mg via INTRAVENOUS

## 2021-07-20 MED ORDER — FENTANYL CITRATE (PF) 250 MCG/5ML IJ SOLN
INTRAMUSCULAR | Status: DC | PRN
Start: 2021-07-20 — End: 2021-07-20
  Administered 2021-07-20 (×2): 50 ug via INTRAVENOUS
  Administered 2021-07-20: 100 ug via INTRAVENOUS
  Administered 2021-07-20: 50 ug via INTRAVENOUS

## 2021-07-20 MED ORDER — DEXAMETHASONE SODIUM PHOSPHATE 10 MG/ML IJ SOLN
INTRAMUSCULAR | Status: AC
Start: 2021-07-20 — End: ?
  Filled 2021-07-20: qty 1

## 2021-07-20 MED ORDER — SCOPOLAMINE 1 MG/3DAYS TD PT72
MEDICATED_PATCH | TRANSDERMAL | Status: AC
  Administered 2021-07-20: 1.5 mg via TRANSDERMAL
  Filled 2021-07-20: qty 1

## 2021-07-20 MED ORDER — ACETAMINOPHEN 500 MG PO TABS
ORAL_TABLET | ORAL | Status: AC
  Administered 2021-07-20: 1000 mg via ORAL
  Filled 2021-07-20: qty 2

## 2021-07-20 MED ORDER — LIDOCAINE 2% (20 MG/ML) 5 ML SYRINGE
INTRAMUSCULAR | Status: DC | PRN
  Administered 2021-07-20: 30 mg via INTRAVENOUS

## 2021-07-20 MED ORDER — INDOCYANINE GREEN 25 MG IV SOLR
INTRAVENOUS | Status: DC | PRN
  Administered 2021-07-20: 2.5 mg via INTRAVENOUS

## 2021-07-20 MED ORDER — BUPIVACAINE-EPINEPHRINE (PF) 0.25% -1:200000 IJ SOLN
INTRAMUSCULAR | Status: AC
Start: 2021-07-20 — End: ?
  Filled 2021-07-20: qty 30

## 2021-07-20 MED ORDER — LACTATED RINGERS IV SOLN: INTRAVENOUS | Status: DC

## 2021-07-20 MED ORDER — AMISULPRIDE (ANTIEMETIC) 5 MG/2ML IV SOLN
INTRAVENOUS | Status: AC
  Filled 2021-07-20: qty 2

## 2021-07-20 MED ORDER — MUPIROCIN 2 % EX OINT
1.0000 "application " | TOPICAL_OINTMENT | Freq: Two times a day (BID) | CUTANEOUS | Status: DC
  Administered 2021-07-20 – 2021-07-21 (×3): 1 via NASAL
  Filled 2021-07-20: qty 22

## 2021-07-20 MED ORDER — HYDROMORPHONE HCL 1 MG/ML IJ SOLN: 0.2500 mg | INTRAMUSCULAR | Status: DC | PRN

## 2021-07-20 MED ORDER — BUPIVACAINE-EPINEPHRINE 0.25% -1:200000 IJ SOLN
INTRAMUSCULAR | Status: DC | PRN
  Administered 2021-07-20: 10 mL
  Administered 2021-07-20: 7 mL

## 2021-07-20 MED ORDER — SCOPOLAMINE 1 MG/3DAYS TD PT72
1.0000 | MEDICATED_PATCH | TRANSDERMAL | Status: DC
Start: 2021-07-20 — End: 2021-07-20

## 2021-07-20 MED ORDER — ETOMIDATE 2 MG/ML IV SOLN
INTRAVENOUS | Status: AC
  Filled 2021-07-20: qty 10

## 2021-07-20 MED ORDER — GLYCOPYRROLATE PF 0.2 MG/ML IJ SOSY
PREFILLED_SYRINGE | INTRAMUSCULAR | Status: AC
Start: 2021-07-20 — End: ?
  Filled 2021-07-20: qty 1

## 2021-07-20 MED ORDER — EPHEDRINE SULFATE-NACL 50-0.9 MG/10ML-% IV SOSY
PREFILLED_SYRINGE | INTRAVENOUS | Status: DC | PRN
  Administered 2021-07-20: 10 mg via INTRAVENOUS

## 2021-07-20 MED ORDER — MEPERIDINE HCL 25 MG/ML IJ SOLN: 6.2500 mg | INTRAMUSCULAR | Status: DC | PRN

## 2021-07-20 MED ORDER — ROCURONIUM BROMIDE 10 MG/ML (PF) SYRINGE
PREFILLED_SYRINGE | INTRAVENOUS | Status: DC | PRN
  Administered 2021-07-20: 60 mg via INTRAVENOUS
  Administered 2021-07-20: 20 mg via INTRAVENOUS

## 2021-07-20 MED ORDER — OXYCODONE HCL 5 MG/5ML PO SOLN: 5.0000 mg | Freq: Once | ORAL | Status: DC | PRN

## 2021-07-20 MED ORDER — CHLORHEXIDINE GLUCONATE CLOTH 2 % EX PADS
6.0000 | MEDICATED_PAD | Freq: Every day | CUTANEOUS | Status: DC
  Administered 2021-07-20 – 2021-07-21 (×2): 6 via TOPICAL

## 2021-07-20 MED ORDER — SUGAMMADEX SODIUM 200 MG/2ML IV SOLN
INTRAVENOUS | Status: DC | PRN
  Administered 2021-07-20: 200 mg via INTRAVENOUS

## 2021-07-20 MED ORDER — SODIUM CHLORIDE 0.9 % IR SOLN
Status: DC | PRN
  Administered 2021-07-20: 1

## 2021-07-20 MED ORDER — FENTANYL CITRATE (PF) 250 MCG/5ML IJ SOLN
INTRAMUSCULAR | Status: AC
  Filled 2021-07-20: qty 5

## 2021-07-20 MED ORDER — CHLORHEXIDINE GLUCONATE 0.12 % MT SOLN
OROMUCOSAL | Status: AC
  Administered 2021-07-20: 15 mL via OROMUCOSAL
  Filled 2021-07-20: qty 15

## 2021-07-20 MED ORDER — ENOXAPARIN SODIUM 40 MG/0.4ML IJ SOSY
40.0000 mg | PREFILLED_SYRINGE | INTRAMUSCULAR | Status: DC
  Administered 2021-07-21: 40 mg via SUBCUTANEOUS
  Filled 2021-07-20: qty 0.4

## 2021-07-20 MED ORDER — ACETAMINOPHEN 500 MG PO TABS: 1000.0000 mg | ORAL_TABLET | Freq: Once | ORAL | Status: AC

## 2021-07-20 MED ORDER — AMISULPRIDE (ANTIEMETIC) 5 MG/2ML IV SOLN
5.0000 mg | Freq: Once | INTRAVENOUS | Status: AC
  Administered 2021-07-20: 5 mg via INTRAVENOUS

## 2021-07-20 MED ORDER — PHENYLEPHRINE 80 MCG/ML (10ML) SYRINGE FOR IV PUSH (FOR BLOOD PRESSURE SUPPORT)
PREFILLED_SYRINGE | INTRAVENOUS | Status: DC | PRN
  Administered 2021-07-20: 320 ug via INTRAVENOUS

## 2021-07-20 MED ORDER — HYDROMORPHONE HCL 1 MG/ML IJ SOLN
0.5000 mg | INTRAMUSCULAR | Status: DC | PRN
  Administered 2021-07-20 (×2): 0.5 mg via INTRAVENOUS
  Filled 2021-07-20 (×2): qty 0.5

## 2021-07-20 MED ORDER — LIDOCAINE 2% (20 MG/ML) 5 ML SYRINGE
INTRAMUSCULAR | Status: AC
  Filled 2021-07-20: qty 5

## 2021-07-20 MED ORDER — CHLORHEXIDINE GLUCONATE 0.12 % MT SOLN
15.0000 mL | Freq: Once | OROMUCOSAL | Status: AC
Start: 2021-07-20 — End: 2021-07-20

## 2021-07-20 MED ORDER — LEVOTHYROXINE SODIUM 25 MCG PO TABS
25.0000 ug | ORAL_TABLET | Freq: Every day | ORAL | Status: DC
  Administered 2021-07-21: 25 ug via ORAL
  Filled 2021-07-20: qty 1

## 2021-07-20 SURGICAL SUPPLY — 53 items
ADH SKN CLS APL DERMABOND .7 (GAUZE/BANDAGES/DRESSINGS) ×2
ADH SKN CLS LQ APL DERMABOND (GAUZE/BANDAGES/DRESSINGS) ×2
APL PRP STRL LF DISP 70% ISPRP (MISCELLANEOUS) ×2
APPLIER CLIP 5 13 M/L LIGAMAX5 (MISCELLANEOUS) ×3
APR CLP MED LRG 5 ANG JAW (MISCELLANEOUS) ×2
BAG COUNTER SPONGE SURGICOUNT (BAG) ×3 IMPLANT
BAG SPEC RTRVL 10 TROC 200 (ENDOMECHANICALS) ×2
BAG SPNG CNTER NS LX DISP (BAG) ×2
BLADE CLIPPER SURG (BLADE) IMPLANT
CANISTER SUCT 3000ML PPV (MISCELLANEOUS) ×3 IMPLANT
CHLORAPREP W/TINT 26 (MISCELLANEOUS) ×3 IMPLANT
CLIP APPLIE 5 13 M/L LIGAMAX5 (MISCELLANEOUS) ×2 IMPLANT
COVER MAYO STAND STRL (DRAPES) ×3 IMPLANT
COVER SURGICAL LIGHT HANDLE (MISCELLANEOUS) ×3 IMPLANT
DERMABOND ADHESIVE PROPEN (GAUZE/BANDAGES/DRESSINGS) ×1
DERMABOND ADVANCED (GAUZE/BANDAGES/DRESSINGS) ×1
DERMABOND ADVANCED .7 DNX12 (GAUZE/BANDAGES/DRESSINGS) IMPLANT
DERMABOND ADVANCED .7 DNX6 (GAUZE/BANDAGES/DRESSINGS) IMPLANT
DRAPE C-ARM 42X120 X-RAY (DRAPES) ×3 IMPLANT
DRSG TEGADERM 2-3/8X2-3/4 SM (GAUZE/BANDAGES/DRESSINGS) ×9 IMPLANT
DRSG TEGADERM 4X4.75 (GAUZE/BANDAGES/DRESSINGS) ×3 IMPLANT
ELECT REM PT RETURN 9FT ADLT (ELECTROSURGICAL) ×3
ELECTRODE REM PT RTRN 9FT ADLT (ELECTROSURGICAL) ×2 IMPLANT
GAUZE SPONGE 2X2 8PLY STRL LF (GAUZE/BANDAGES/DRESSINGS) ×2 IMPLANT
GLOVE BIOGEL M STRL SZ7.5 (GLOVE) ×3 IMPLANT
GLOVE INDICATOR 8.0 STRL GRN (GLOVE) ×6 IMPLANT
GOWN STRL REUS W/ TWL LRG LVL3 (GOWN DISPOSABLE) ×6 IMPLANT
GOWN STRL REUS W/TWL 2XL LVL3 (GOWN DISPOSABLE) ×3 IMPLANT
GOWN STRL REUS W/TWL LRG LVL3 (GOWN DISPOSABLE) ×9
GRASPER SUT TROCAR 14GX15 (MISCELLANEOUS) ×3 IMPLANT
KIT BASIN OR (CUSTOM PROCEDURE TRAY) ×3 IMPLANT
KIT TURNOVER KIT B (KITS) ×3 IMPLANT
NS IRRIG 1000ML POUR BTL (IV SOLUTION) ×3 IMPLANT
PAD ARMBOARD 7.5X6 YLW CONV (MISCELLANEOUS) ×3 IMPLANT
POUCH RETRIEVAL ECOSAC 10 (ENDOMECHANICALS) ×2 IMPLANT
POUCH RETRIEVAL ECOSAC 10MM (ENDOMECHANICALS) ×3
SCISSORS LAP 5X35 DISP (ENDOMECHANICALS) ×3 IMPLANT
SET CHOLANGIOGRAPH 5 50 .035 (SET/KITS/TRAYS/PACK) ×3 IMPLANT
SET IRRIG TUBING LAPAROSCOPIC (IRRIGATION / IRRIGATOR) ×3 IMPLANT
SET TUBE SMOKE EVAC HIGH FLOW (TUBING) ×3 IMPLANT
SLEEVE ENDOPATH XCEL 5M (ENDOMECHANICALS) ×6 IMPLANT
SPECIMEN JAR SMALL (MISCELLANEOUS) ×3 IMPLANT
SPONGE GAUZE 2X2 STER 10/PKG (GAUZE/BANDAGES/DRESSINGS) ×1
STRIP CLOSURE SKIN 1/2X4 (GAUZE/BANDAGES/DRESSINGS) ×3 IMPLANT
SUT MNCRL AB 4-0 PS2 18 (SUTURE) ×3 IMPLANT
SUT VIC AB 0 UR5 27 (SUTURE) ×3 IMPLANT
SUT VICRYL 0 UR6 27IN ABS (SUTURE) IMPLANT
TOWEL GREEN STERILE (TOWEL DISPOSABLE) ×3 IMPLANT
TOWEL GREEN STERILE FF (TOWEL DISPOSABLE) ×3 IMPLANT
TRAY LAPAROSCOPIC MC (CUSTOM PROCEDURE TRAY) ×3 IMPLANT
TROCAR XCEL BLUNT TIP 100MML (ENDOMECHANICALS) ×3 IMPLANT
TROCAR XCEL NON-BLD 5MMX100MML (ENDOMECHANICALS) ×3 IMPLANT
WATER STERILE IRR 1000ML POUR (IV SOLUTION) ×3 IMPLANT

## 2021-07-20 NOTE — Care Management Obs Status (Signed)
[facility] NOTIFICATION   Patient Details  Name: TAIESHA BOVARD MRN: [id number] Date of Birth: [birth year]   Medicare Observation Status Notification Given:  Yes    Bartholomew Crews, RN 07/20/2021, 2:08 PM

## 2021-07-20 NOTE — Transfer of Care (Signed)
Immediate Anesthesia Transfer of Care Note  Patient: Cassidy Terry  Procedure(s) Performed: LAPAROSCOPIC CHOLECYSTECTOMY (Abdomen) INDOCYANINE GREEN FLUORESCENCE IMAGING (ICG)  Patient Location: PACU  Anesthesia Type:General  Level of Consciousness: awake and alert   Airway & Oxygen Therapy: Patient Spontanous Breathing and Patient connected to face mask oxygen  Post-op Assessment: Report given to RN and Post -op Vital signs reviewed and stable  Post vital signs: Reviewed and stable  Last Vitals:  Vitals Value Taken Time  BP 120/60 07/20/21 1017  Temp    Pulse 76 07/20/21 1019  Resp 12 07/20/21 1019  SpO2 99 % 07/20/21 1019  Vitals shown include unvalidated device data.  Last Pain:  Vitals:   07/20/21 0757  TempSrc: Oral  PainSc:          Complications: No notable events documented.

## 2021-07-20 NOTE — Care Management CC44 (Signed)
Condition Code 44 Documentation Completed  Patient Details  Name: Cassidy Terry MRN: [id number] Date of Birth: 1955-10-21   Condition Code 44 given:  Yes Patient signature on Condition Code 44 notice:  Yes Documentation of 2 MD's agreement:  Yes Code 44 added to claim:  Yes    Bartholomew Crews, RN 07/20/2021, 2:08 PM

## 2021-07-20 NOTE — Anesthesia Preprocedure Evaluation (Addendum)
Anesthesia Evaluation  Patient identified by MRN, date of birth, ID band Patient awake    Reviewed: Allergy & Precautions, NPO status , Patient's Chart, lab work & pertinent test results  History of Anesthesia Complications Negative for: history of anesthetic complications  Airway Mallampati: II  TM Distance: <3 FB Neck ROM: Full    Dental  (+) Dental Advisory Given, Teeth Intact   Pulmonary sleep apnea (mild, does not require CPAP) , former smoker,    breath sounds clear to auscultation       Cardiovascular hypertension, Pt. on medications (-) angina Rhythm:Regular Rate:Normal     Neuro/Psych Depression negative neurological ROS     GI/Hepatic Neg liver ROS, cholecystitis   Endo/Other  Hypothyroidism   Renal/GU negative Renal ROS     Musculoskeletal   Abdominal   Peds  Hematology negative hematology ROS (+)   Anesthesia Other Findings   Reproductive/Obstetrics                            Anesthesia Physical Anesthesia Plan  ASA: 2  Anesthesia Plan: General   Post-op Pain Management: Tylenol PO (pre-op)*   Induction: Intravenous  PONV Risk Score and Plan: 3 and Ondansetron, Dexamethasone and Scopolamine patch - Pre-op  Airway Management Planned: Oral ETT  Additional Equipment: None  Intra-op Plan:   Post-operative Plan: Extubation in OR  Informed Consent: I have reviewed the patients History and Physical, chart, labs and discussed the procedure including the risks, benefits and alternatives for the proposed anesthesia with the patient or authorized representative who has indicated his/her understanding and acceptance.     Dental advisory given  Plan Discussed with: CRNA and Surgeon  Anesthesia Plan Comments:        Anesthesia Quick Evaluation

## 2021-07-20 NOTE — Anesthesia Postprocedure Evaluation (Signed)
Anesthesia Post Note  Patient: KENESHIA TENA  Procedure(s) Performed: LAPAROSCOPIC CHOLECYSTECTOMY (Abdomen) INDOCYANINE GREEN FLUORESCENCE IMAGING (ICG)     Patient location during evaluation: PACU Anesthesia Type: General Level of consciousness: awake and alert, patient cooperative and oriented Pain management: pain level controlled Vital Signs Assessment: post-procedure vital signs reviewed and stable Respiratory status: spontaneous breathing, nonlabored ventilation and respiratory function stable Cardiovascular status: blood pressure returned to baseline and stable Postop Assessment: no apparent nausea or vomiting (nausea relieved) Anesthetic complications: no   No notable events documented.  Last Vitals:  Vitals:   07/20/21 1045 07/20/21 1057  BP: 111/66 123/65  Pulse: 74 76  Resp: 10 16  Temp: 36.6 C 36.4 C  SpO2: 94% 99%    Last Pain:  Vitals:   07/20/21 1100  TempSrc:   PainSc: 10-Worst pain ever                 Jaicob Dia,E. Renzo Vincelette

## 2021-07-20 NOTE — Interval H&P Note (Signed)
History and Physical Interval Note:  07/20/2021 7:55 AM  Cassidy Terry  has presented today for surgery, with the diagnosis of CHOLECYSTITIS.  The various methods of treatment have been discussed with the patient and family. After consideration of risks, benefits and other options for treatment, the patient has consented to  Procedure(s): LAPAROSCOPIC CHOLECYSTECTOMY WITH INTRAOPERATIVE CHOLANGIOGRAM (N/A) INDOCYANINE GREEN FLUORESCENCE IMAGING (ICG) as a surgical intervention.  The patient's history has been reviewed, patient examined, no change in status, stable for surgery.  I have reviewed the patient's chart and labs.  Questions were answered to the patient's satisfaction.    I believe the patient's symptoms are consistent with gallbladder disease.    I discussed laparoscopic cholecystectomy with possible IOC in detail.  The patient was diagrams detailing the procedure.  We discussed the risks and benefits of a laparoscopic cholecystectomy including, but not limited to bleeding, infection, injury to surrounding structures such as the intestine or liver, bile leak, retained gallstones, need to convert to an open procedure, prolonged diarrhea, blood clots such as  DVT, common bile duct injury, anesthesia risks, and possible need for additional procedures.  We discussed the typical post-operative recovery course. I explained that the likelihood of improvement of their symptoms is good.  Greer Pickerel

## 2021-07-20 NOTE — Op Note (Signed)
Cassidy Terry [id number] [birth year] 07/20/2021  Laparoscopic Cholecystectomy with near infrared immunofluorescence procedure Note  Indications: This patient presents with symptomatic gallbladder disease and will undergo laparoscopic cholecystectomy.  Pre-operative Diagnosis: Acute calculus cholecystitis  Post-operative Diagnosis: Same  Surgeon: Greer Pickerel MD FACS  Assistants: None  Anesthesia: General endotracheal anesthesia  Procedure Details  The patient was seen again in the Holding Room. The risks, benefits, complications, treatment options, and expected outcomes were discussed with the patient. The possibilities of reaction to medication, pulmonary aspiration, perforation of viscus, bleeding, recurrent infection, finding a normal gallbladder, the need for additional procedures, failure to diagnose a condition, the possible need to convert to an open procedure, and creating a complication requiring transfusion or operation were discussed with the patient. The likelihood of improving the patient's symptoms with return to their baseline status is good.  The patient and/or family concurred with the proposed plan, giving informed consent. The site of surgery properly noted. The patient was taken to Operating Room, identified as Cassidy Terry and the procedure verified as Laparoscopic Cholecystectomy with possible intraoperative Cholangiogram. A Time Out was held and the above information confirmed. Antibiotic prophylaxis was administered.  The patient was administered ICG dye in preop by anesthesia.  Prior to the induction of general anesthesia, antibiotic prophylaxis was administered. General endotracheal anesthesia was then administered and tolerated well. After the induction, the abdomen was prepped with Chloraprep and draped in the sterile fashion. The patient was positioned in the supine position.  Local anesthetic agent was injected into the skin near the umbilicus and an incision  made.  Patient had a previous lower midline incision from a C-section as well as a small curvilinear infraumbilical scar.  We dissected down to the abdominal fascia with blunt dissection.  The fascia was incised vertically and we entered the peritoneal cavity bluntly.  A pursestring suture of 0-Vicryl was placed around the fascial opening.  The Hasson cannula was inserted and secured with the stay suture.  Pneumoperitoneum was then created with CO2 and tolerated well without any adverse changes in the patient's vital signs. An 5-mm port was placed in the subxiphoid position.  Two 5-mm ports were placed in the right upper quadrant. All skin incisions were infiltrated with a local anesthetic agent before making the incision and placing the trocars.   We positioned the patient in reverse Trendelenburg, tilted slightly to the patient's left.  The gallbladder was identified.it was very distended.  In order to facilitate retraction and grasping I aspirated the gallbladder.  The fundus grasped and retracted cephalad. Adhesions were lysed bluntly and with the electrocautery where indicated, taking care not to injure any adjacent organs or viscus. The infundibulum was grasped and retracted laterally, exposing the peritoneum overlying the triangle of Calot. This was then divided and exposed in a blunt fashion.  There was a fair amount of friable inflammation around this area.  A critical view of the cystic duct and cystic artery was obtained.  Near infrared immunofluorescence activity was visualized in the liver, common bile duct and small bowel.  I did not see any immunofluorescent activity in the cystic duct nor gallbladder.  There was only 1 ductal structure entering the gallbladder and then the cystic artery.  I could visualize the common bile duct near infrared rest and activity.  The cystic duct was clearly identified and bluntly dissected circumferentially.   The cystic duct was then ligated with clips and divided.  The cystic artery which had been identified & dissected  free was ligated with clips and divided as well.   The gallbladder was dissected from the liver bed in retrograde fashion with the electrocautery.  The posterior wall of the gallbladder was densely adhered to the liver surface.  There was some bleeding from the liver surface.  The gallbladder was removed and placed in an Ecco sac.  The gallbladder and Ecco sac were then removed through the umbilical port site. The liver bed was irrigated and inspected. Hemostasis was achieved with the electrocautery. Copious irrigation was utilized and was repeatedly aspirated until clear.  The pursestring suture was used to close the umbilical fascia.  There was an air leak.  I ended up placing 4 additional interrupted 0 Vicryl sutures using the PMI with suture passer.  We again inspected the right upper quadrant for hemostasis.  The umbilical closure was inspected and there was no air leak and nothing trapped within the closure. Pneumoperitoneum was released as we removed the trocars.  4-0 Monocryl was used to close the skin.   Dermabond was applied. The patient was then extubated and brought to the recovery room in stable condition. Instrument, sponge, and needle counts were correct at closure and at the conclusion of the case.   Findings: Cholecystitis with Cholelithiasis Near infrared immunofluorescent activity was identified in the common bile duct and small bowel and liver.  The gallbladder and cystic duct did not have any near infrared immunofluorescence activity visualized within it.  Estimated Blood Loss: Minimal         Drains: None         Specimens: Gallbladder           Complications: None; patient tolerated the procedure well.         Disposition: PACU - hemodynamically stable.         Condition: stable  Cassidy Terry. Cassidy Pulling, MD, FACS General, Bariatric, & Minimally Invasive Surgery [facility], [place]

## 2021-07-20 NOTE — Progress Notes (Addendum)
Patient arrived to 6N24, [place], ambulatory. VSS. See full assessment for further details. Patient was oriented to call bell and surroundings. Call bell within reach and will continue to monitor.  notified P. Stechschulte,MD about's patient arrival.

## 2021-07-20 NOTE — Anesthesia Procedure Notes (Signed)
Procedure Name: Intubation Date/Time: 07/20/2021 8:23 AM  Performed by: Reece Agar, CRNAPre-anesthesia Checklist: Patient identified, Emergency Drugs available, Suction available and Patient being monitored Patient Re-evaluated:Patient Re-evaluated prior to induction Oxygen Delivery Method: Circle System Utilized Preoxygenation: Pre-oxygenation with 100% oxygen Induction Type: IV induction Ventilation: Mask ventilation without difficulty Laryngoscope Size: Mac and 3 Grade View: Grade III Tube type: Oral Tube size: 7.0 mm Number of attempts: 1 Airway Equipment and Method: Stylet Placement Confirmation: ETT inserted through vocal cords under direct vision, positive ETCO2 and breath sounds checked- equal and bilateral Secured at: 22 cm Tube secured with: Tape Dental Injury: Teeth and Oropharynx as per pre-operative assessment

## 2021-07-20 NOTE — Discharge Instructions (Signed)
There was an incidential 3.5 cm right upper pole renal mass seen on ultrasound. The radiologist recommend elective outpatient MR kidney without and with contrast for further characterization. Please follow up with your pcp.    [facility]  Please arrive at least 30 min before your appointment to complete your check in paperwork.  If you are unable to arrive 30 min prior to your appointment time we may have to cancel or reschedule you. LAPAROSCOPIC SURGERY: POST OP INSTRUCTIONS Always review your discharge instruction sheet given to you by the facility where your surgery was performed. IF YOU HAVE DISABILITY OR FAMILY LEAVE FORMS, YOU MUST BRING THEM TO THE OFFICE FOR PROCESSING.   DO NOT GIVE THEM TO YOUR DOCTOR.  PAIN CONTROL  First take acetaminophen (Tylenol) AND/or ibuprofen (Advil) to control your pain after surgery.  Follow directions on package.  Taking acetaminophen (Tylenol) and/or ibuprofen (Advil) regularly after surgery will help to control your pain and lower the amount of prescription pain medication you may need.  You should not take more than 4,000 mg (4 grams) of acetaminophen (Tylenol) in 24 hours.  You should not take ibuprofen (Advil), aleve, motrin, naprosyn or other NSAIDS if you have a history of stomach ulcers or chronic kidney disease.  A prescription for pain medication may be given to you upon discharge.  Take your pain medication as prescribed, if you still have uncontrolled pain after taking acetaminophen (Tylenol) or ibuprofen (Advil). Use ice packs to help control pain. If you need a refill on your pain medication, please contact your pharmacy.  They will contact our office to request authorization. Prescriptions will not be filled after 5pm or on week-ends.  HOME MEDICATIONS Take your usually prescribed medications unless otherwise directed.  DIET You should follow a light diet the first few days after arrival home.  Be sure to include lots  of fluids daily. Avoid fatty, fried foods.   CONSTIPATION It is common to experience some constipation after surgery and if you are taking pain medication.  Increasing fluid intake and taking a stool softener (such as Colace) will usually help or prevent this problem from occurring.  A mild laxative (Milk of Magnesia or Miralax) should be taken according to package instructions if there are no bowel movements after 48 hours.  WOUND/INCISION CARE Most patients will experience some swelling and bruising in the area of the incisions.  Ice packs will help.  Swelling and bruising can take several days to resolve.  Unless discharge instructions indicate otherwise, follow guidelines below  STERI-STRIPS - you may remove your outer bandages 48 hours after surgery, and you may shower at that time.  You have steri-strips (small skin tapes) in place directly over the incision.  These strips should be left on the skin for 7-10 days.   DERMABOND/SKIN GLUE - you may shower in 24 hours.  The glue will flake off over the next 2-3 weeks. Any sutures or staples will be removed at the office during your follow-up visit.  ACTIVITIES You may resume regular (light) daily activities beginning the next day--such as daily self-care, walking, climbing stairs--gradually increasing activities as tolerated.  You may have sexual intercourse when it is comfortable.  Refrain from any heavy lifting or straining until approved by your doctor. You may drive when you are no longer taking prescription pain medication, you can comfortably wear a seatbelt, and you can safely maneuver your car and apply brakes.  FOLLOW-UP You should see your doctor in  the office for a follow-up appointment approximately 2-3 weeks after your surgery.  You should have been given your post-op/follow-up appointment when your surgery was scheduled.  If you did not receive a post-op/follow-up appointment, make sure that you call for this appointment within a day  or two after you arrive home to insure a convenient appointment time.  OTHER INSTRUCTIONS  WHEN TO CALL YOUR DOCTOR: Fever over 101.0 Inability to urinate Continued bleeding from incision. Increased pain, redness, or drainage from the incision. Increasing abdominal pain  The clinic staff is available to answer your questions during regular business hours.  Please don't hesitate to call and ask to speak to one of the nurses for clinical concerns.  If you have a medical emergency, go to the nearest emergency room or call 911.  A surgeon from [facility] is always on call at the hospital. [address] [place], [place], [place]  [address] ? [address], [place], [place]   [address] [phone] ? [phone] ? FAX [phone]

## 2021-07-21 ENCOUNTER — Encounter (HOSPITAL_COMMUNITY): Payer: Self-pay | Admitting: General Surgery

## 2021-07-21 LAB — CBC
HCT: 37 % (ref 36.0–46.0)
Hemoglobin: 12.2 g/dL (ref 12.0–15.0)
MCH: 31.7 pg (ref 26.0–34.0)
MCHC: 33 g/dL (ref 30.0–36.0)
MCV: 96.1 fL (ref 80.0–100.0)
Platelets: 249 10*3/uL (ref 150–400)
RBC: 3.85 MIL/uL — ABNORMAL LOW (ref 3.87–5.11)
RDW: 12.2 % (ref 11.5–15.5)
WBC: 12.2 10*3/uL — ABNORMAL HIGH (ref 4.0–10.5)
nRBC: 0 % (ref 0.0–0.2)

## 2021-07-21 MED ORDER — OXYCODONE HCL 5 MG PO TABS: 5.0000 mg | ORAL_TABLET | ORAL | 0 refills | Status: DC | PRN

## 2021-07-21 NOTE — Plan of Care (Signed)

## 2021-07-21 NOTE — Progress Notes (Signed)
Pt was able to tolerate her  Regular diet. No abdominal pain or nausea reported. AVS was given and explained to pt. All questions were answered.

## 2021-07-21 NOTE — Progress Notes (Signed)
Pt was able to walk around the nurses station with stand-by-assist. No GI issue reported after the ambulation. Pt ordered lunch on Regular diet, DC after if no complains.

## 2021-07-21 NOTE — Discharge Summary (Signed)
Patient ID: Cassidy Terry [id number] 17-Oct-1955 66 y.o.  Admit date: 07/19/2021 Discharge date: 07/21/2021  Admitting Diagnosis: Acute Cholecystitis  Discharge Diagnosis Acute Cholecystitis  Right upper pole renal mass  Consultants None  HPI Cassidy Terry is an 66 y.o. female who is here for abdominal pain.  It has been for about the last 10 days.  She had elevation in her LFTs as an outpatient and was imaged and sent to the ER by her primary care physician.  She has been having pain in the right upper quadrant after eating off and on for a few months.  It was bad over the last ten days.  She has basically avoided eating.  Procedures Laparoscopic cholecystectomy -Dr. Rock Nephew - 07/20/2021  Hospital Course:  The patient was admitted as above for Acute Cholecystitis and underwent a laparoscopic cholecystectomy.  The patient tolerated the procedure well.  On POD 1, the patient was tolerating a regular diet, voiding well, mobilizing, and pain was controlled with oral pain medications.  The patient was stable for DC home at this time with appropriate follow up made. Discussed discharge instructions, restrictions and return/call back precautions. Patient reports she does not need a note for work.  There was an incidential 3.5 cm right upper pole renal mass seen on ultrasound. The radiologist recommend elective outpatient MR kidney without and with contrast for further characterization. Discussed with patient the incidental finding and recommendation for pcp follow up.   Physical Exam: Gen:  Alert, NAD, pleasant HEENT: EOM's intact, pupils equal and round Card:  RRR Pulm:  CTAB, no W/R/R, effort normal Abd: Soft, ND, appropriately tender around incisions, no peritonitis, +BS. Incisions with glue intact appears well and are without drainage, bleeding, or signs of infection Ext:  No LE edema  Psych: A&Ox3  Skin: no rashes noted, warm and dry  Allergies as of 07/21/2021       Reactions    Latex    Fever blisters   Monascus Purpureus Went Yeast Other (See Comments)   Myalgias   Simvastatin Other (See Comments)   Myalgias   Sulfa Antibiotics    Unknown reaction        Medication List     STOP taking these medications    traMADol 50 MG tablet Commonly known as: ULTRAM       TAKE these medications    cetirizine 10 MG tablet Commonly known as: ZYRTEC Take 10 mg by mouth daily.   D3-1000 25 MCG (1000 UT) tablet Generic drug: Cholecalciferol Take 1,000 Units by mouth daily.   escitalopram 10 MG tablet Commonly known as: LEXAPRO Take 10 mg by mouth daily.   ezetimibe 10 MG tablet Commonly known as: ZETIA Take 10 mg by mouth daily.   levothyroxine 25 MCG tablet Commonly known as: SYNTHROID Take 25 mcg by mouth daily before breakfast.   losartan 25 MG tablet Commonly known as: COZAAR Take 25 mg by mouth daily.   oxyCODONE 5 MG immediate release tablet Commonly known as: Oxy IR/ROXICODONE Take 1 tablet (5 mg total) by mouth every 4 (four) hours as needed for breakthrough pain.          Follow-up [facility], [place]. Call.   Specialty: General Surgery Why: We are working on your appointment, call to confirm Please arrive 30 minutes prior to your appointment to check in and fill out paperwork. Bring photo ID and Doctor, general practice information: [facility]  [place] [phone]        Cassidy Jordan, MD. Schedule an appointment as soon as possible for a visit.   Specialty: Family Medicine Why: There was an incidential 3.5 cm right upper pole renal mass seen on ultrasound. The radiologist recommend elective outpatient MR kidney without and with contrast for further characterization. Contact information: [place] [address] [phone]                 Signed: Alferd Terry, [facility]  Surgery 07/21/2021, 10:15 AM Please see Amion for pager number during day hours 7:00am-4:30pm

## 2021-07-22 LAB — SURGICAL PATHOLOGY

## 2021-07-28 DIAGNOSIS — N2889 Other specified disorders of kidney and ureter: Secondary | ICD-10-CM | POA: Diagnosis not present

## 2021-07-28 DIAGNOSIS — Z9049 Acquired absence of other specified parts of digestive tract: Secondary | ICD-10-CM | POA: Diagnosis not present

## 2021-07-28 DIAGNOSIS — K810 Acute cholecystitis: Secondary | ICD-10-CM | POA: Diagnosis not present

## 2021-08-04 ENCOUNTER — Other Ambulatory Visit: Payer: Self-pay | Admitting: Family Medicine

## 2021-08-05 ENCOUNTER — Other Ambulatory Visit: Payer: Self-pay | Admitting: Family Medicine

## 2021-08-05 DIAGNOSIS — N2889 Other specified disorders of kidney and ureter: Secondary | ICD-10-CM

## 2021-08-17 ENCOUNTER — Ambulatory Visit
Admission: RE | Admit: 2021-08-17 | Discharge: 2021-08-17 | Disposition: A | Discharge status: Home / Self Care | Payer: Medicare Other | Source: Ambulatory Visit | Attending: Family Medicine | Admitting: Family Medicine

## 2021-08-17 DIAGNOSIS — Z9049 Acquired absence of other specified parts of digestive tract: Secondary | ICD-10-CM | POA: Diagnosis not present

## 2021-08-17 DIAGNOSIS — K760 Fatty (change of) liver, not elsewhere classified: Secondary | ICD-10-CM | POA: Diagnosis not present

## 2021-08-17 DIAGNOSIS — N2889 Other specified disorders of kidney and ureter: Secondary | ICD-10-CM

## 2021-08-17 DIAGNOSIS — N281 Cyst of kidney, acquired: Secondary | ICD-10-CM | POA: Diagnosis not present

## 2021-08-17 MED ORDER — GADOBENATE DIMEGLUMINE 529 MG/ML IV SOLN
15.0000 mL | Freq: Once | INTRAVENOUS | Status: AC | PRN
  Administered 2021-08-17: 15 mL via INTRAVENOUS

## 2021-09-09 DIAGNOSIS — D49511 Neoplasm of unspecified behavior of right kidney: Secondary | ICD-10-CM | POA: Diagnosis not present

## 2021-09-10 ENCOUNTER — Other Ambulatory Visit (HOSPITAL_COMMUNITY): Payer: Self-pay | Admitting: Urology

## 2021-09-10 ENCOUNTER — Ambulatory Visit (HOSPITAL_COMMUNITY)
Admission: RE | Admit: 2021-09-10 | Discharge: 2021-09-10 | Disposition: A | Discharge status: Home / Self Care | Payer: Medicare Other | Source: Ambulatory Visit | Attending: Urology | Admitting: Urology

## 2021-09-10 DIAGNOSIS — J9811 Atelectasis: Secondary | ICD-10-CM | POA: Diagnosis not present

## 2021-09-10 DIAGNOSIS — C641 Malignant neoplasm of right kidney, except renal pelvis: Secondary | ICD-10-CM

## 2021-09-22 ENCOUNTER — Other Ambulatory Visit: Payer: Self-pay | Admitting: Urology

## 2021-10-01 DIAGNOSIS — C641 Malignant neoplasm of right kidney, except renal pelvis: Secondary | ICD-10-CM | POA: Diagnosis not present

## 2021-10-20 DIAGNOSIS — R399 Unspecified symptoms and signs involving the genitourinary system: Secondary | ICD-10-CM | POA: Diagnosis not present

## 2021-10-20 DIAGNOSIS — Z23 Encounter for immunization: Secondary | ICD-10-CM | POA: Diagnosis not present

## 2021-10-20 NOTE — Patient Instructions (Addendum)
DUE TO COVID-19 ONLY TWO VISITORS  (aged 66 and older)  ARE ALLOWED TO COME WITH YOU AND STAY IN THE WAITING ROOM ONLY DURING PRE OP AND PROCEDURE.   **NO VISITORS ARE ALLOWED IN THE SHORT STAY AREA OR RECOVERY ROOM!!**  IF YOU WILL BE ADMITTED INTO THE HOSPITAL YOU ARE ALLOWED ONLY FOUR SUPPORT PEOPLE DURING VISITATION HOURS ONLY (7 AM -8PM)   The support person(s) must pass our screening, gel in and out, and wear a mask at all times, including in the patient's room. Patients must also wear a mask when staff or their support person are in the room. Visitors GUEST BADGE MUST BE WORN VISIBLY  One adult visitor may remain with you overnight and MUST be in the room by 8 P.M.     Your procedure is scheduled on: 11/03/21   Report to [facility] Main Entrance    Report to admitting at : 10:00 AM   Call this number if you have problems the morning of surgery [phone]   Clear liquids diet starting day before surgery until :  9:00 AM DAY OF SURGERY  Water Black Coffee (sugar ok, NO MILK/CREAM OR CREAMERS)  Tea (sugar ok, NO MILK/CREAM OR CREAMERS) regular and decaf                             Plain Jell-O (NO RED)                                           Fruit ices (not with fruit pulp, NO RED)                                     Popsicles (NO RED)                                                                  Juice: apple, WHITE grape, WHITE cranberry Sports drinks like Gatorade (NO RED)             FOLLOW BOWEL PREP AND ANY ADDITIONAL PRE OP INSTRUCTIONS YOU RECEIVED FROM YOUR SURGEON'S OFFICE!!!   Oral Hygiene is also important to reduce your risk of infection.                                    Remember - BRUSH YOUR TEETH THE MORNING OF SURGERY WITH YOUR REGULAR TOOTHPASTE   Do NOT smoke after Midnight   Take these medicines the morning of surgery with A SIP OF WATER: escitalopram,amlodipine,synthroid.  DO NOT TAKE ANY ORAL DIABETIC MEDICATIONS DAY OF YOUR  SURGERY  Bring CPAP mask and tubing day of surgery.                              You may not have any metal on your body including hair pins, jewelry, and body piercing  Do not wear make-up, lotions, powders, perfumes/cologne, or deodorant  Do not wear nail polish including gel and S&S, artificial/acrylic nails, or any other type of covering on natural nails including finger and toenails. If you have artificial nails, gel coating, etc. that needs to be removed by a nail salon please have this removed prior to surgery or surgery may need to be canceled/ delayed if the surgeon/ anesthesia feels like they are unable to be safely monitored.   Do not shave  48 hours prior to surgery.    Do not bring valuables to the hospital. [place].   Contacts, dentures or bridgework may not be worn into surgery.   Bring small overnight bag day of surgery.   DO NOT [place]. PHARMACY WILL DISPENSE MEDICATIONS LISTED ON YOUR MEDICATION LIST TO YOU DURING YOUR ADMISSION [place]!    Patients discharged on the day of surgery will not be allowed to drive home.  Someone NEEDS to stay with you for the first 24 hours after anesthesia.   Special Instructions: Bring a copy of your healthcare power of attorney and living will documents         the day of surgery if you haven't scanned them before.              Please read over the following fact sheets you were given: IF YOU HAVE QUESTIONS ABOUT YOUR PRE-OP INSTRUCTIONS PLEASE CALL [phone]     [facility] - Preparing for Surgery Before surgery, you can play an important role.  Because skin is not sterile, your skin needs to be as free of germs as possible.  You can reduce the number of germs on your skin by washing with CHG (chlorahexidine gluconate) soap before surgery.  CHG is an antiseptic cleaner which kills germs and bonds with the skin to continue  killing germs even after washing. Please DO NOT use if you have an allergy to CHG or antibacterial soaps.  If your skin becomes reddened/irritated stop using the CHG and inform your nurse when you arrive at Short Stay. Do not shave (including legs and underarms) for at least 48 hours prior to the first CHG shower.  You may shave your face/neck. Please follow these instructions carefully:  1.  Shower with CHG Soap the night before surgery and the  morning of Surgery.  2.  If you choose to wash your hair, wash your hair first as usual with your  normal  shampoo.  3.  After you shampoo, rinse your hair and body thoroughly to remove the  shampoo.                           4.  Use CHG as you would any other liquid soap.  You can apply chg directly  to the skin and wash                       Gently with a scrungie or clean washcloth.  5.  Apply the CHG Soap to your body ONLY FROM THE NECK DOWN.   Do not use on face/ open                           Wound or open sores. Avoid contact with  eyes, ears mouth and genitals (private parts).                       Wash face,  Genitals (private parts) with your normal soap.             6.  Wash thoroughly, paying special attention to the area where your surgery  will be performed.  7.  Thoroughly rinse your body with warm water from the neck down.  8.  DO NOT shower/wash with your normal soap after using and rinsing off  the CHG Soap.                9.  Pat yourself dry with a clean towel.            10.  Wear clean pajamas.            11.  Place clean sheets on your bed the night of your first shower and do not  sleep with pets. Day of Surgery : Do not apply any lotions/deodorants the morning of surgery.  Please wear clean clothes to the hospital/surgery center.  FAILURE TO FOLLOW THESE INSTRUCTIONS MAY RESULT IN THE CANCELLATION OF YOUR SURGERY PATIENT SIGNATURE_________________________________  NURSE  SIGNATURE__________________________________  ________________________________________________________________________   Adam Phenix  An incentive spirometer is a tool that can help keep your lungs clear and active. This tool measures how well you are filling your lungs with each breath. Taking long deep breaths may help reverse or decrease the chance of developing breathing (pulmonary) problems (especially infection) following: A long period of time when you are unable to move or be active. BEFORE THE PROCEDURE  If the spirometer includes an indicator to show your best effort, your nurse or respiratory therapist will set it to a desired goal. If possible, sit up straight or lean slightly forward. Try not to slouch. Hold the incentive spirometer in an upright position. INSTRUCTIONS FOR USE  Sit on the edge of your bed if possible, or sit up as far as you can in bed or on a chair. Hold the incentive spirometer in an upright position. Breathe out normally. Place the mouthpiece in your mouth and seal your lips tightly around it. Breathe in slowly and as deeply as possible, raising the piston or the ball toward the top of the column. Hold your breath for 3-5 seconds or for as long as possible. Allow the piston or ball to fall to the bottom of the column. Remove the mouthpiece from your mouth and breathe out normally. Rest for a few seconds and repeat Steps 1 through 7 at least 10 times every 1-2 hours when you are awake. Take your time and take a few normal breaths between deep breaths. The spirometer may include an indicator to show your best effort. Use the indicator as a goal to work toward during each repetition. After each set of 10 deep breaths, practice coughing to be sure your lungs are clear. If you have an incision (the cut made at the time of surgery), support your incision when coughing by placing a pillow or rolled up towels firmly against it. Once you are able to get out of  bed, walk around indoors and cough well. You may stop using the incentive spirometer when instructed by your caregiver.  RISKS AND COMPLICATIONS Take your time so you do not get dizzy or light-headed. If you are in pain, you may need to take or ask for pain medication before doing incentive  spirometry. It is harder to take a deep breath if you are having pain. AFTER USE Rest and breathe slowly and easily. It can be helpful to keep track of a log of your progress. Your caregiver can provide you with a simple table to help with this. If you are using the spirometer at home, follow these instructions: [place] IF:  You are having difficultly using the spirometer. You have trouble using the spirometer as often as instructed. Your pain medication is not giving enough relief while using the spirometer. You develop fever of 100.5 F (38.1 C) or higher. SEEK IMMEDIATE MEDICAL CARE IF:  You cough up bloody sputum that had not been present before. You develop fever of 102 F (38.9 C) or greater. You develop worsening pain at or near the incision site. MAKE SURE YOU:  Understand these instructions. Will watch your condition. Will get help right away if you are not doing well or get worse. Document Released: 06/08/2006 Document Revised: 04/20/2011 Document Reviewed: 08/09/2006 [facility] Patient Information 2014 [place], [place].   ________________________________________________________________________

## 2021-10-21 ENCOUNTER — Other Ambulatory Visit: Payer: Self-pay

## 2021-10-21 ENCOUNTER — Encounter (HOSPITAL_COMMUNITY): Payer: Self-pay

## 2021-10-21 ENCOUNTER — Encounter (HOSPITAL_COMMUNITY)
Admission: RE | Admit: 2021-10-21 | Discharge: 2021-10-21 | Disposition: A | Discharge status: Home / Self Care | Payer: Medicare Other | Source: Ambulatory Visit | Attending: Urology | Admitting: Urology

## 2021-10-21 VITALS — BP 133/68 | HR 78 | Temp 98.1°F | Ht 67.0 in | Wt 170.0 lb

## 2021-10-21 DIAGNOSIS — I2510 Atherosclerotic heart disease of native coronary artery without angina pectoris: Secondary | ICD-10-CM | POA: Diagnosis not present

## 2021-10-21 DIAGNOSIS — Z01812 Encounter for preprocedural laboratory examination: Secondary | ICD-10-CM | POA: Insufficient documentation

## 2021-10-21 HISTORY — DX: Unspecified osteoarthritis, unspecified site: M19.90

## 2021-10-21 LAB — CBC
HCT: 45 % (ref 36.0–46.0)
Hemoglobin: 14.5 g/dL (ref 12.0–15.0)
MCH: 31.7 pg (ref 26.0–34.0)
MCHC: 32.2 g/dL (ref 30.0–36.0)
MCV: 98.5 fL (ref 80.0–100.0)
Platelets: 200 10*3/uL (ref 150–400)
RBC: 4.57 MIL/uL (ref 3.87–5.11)
RDW: 13.3 % (ref 11.5–15.5)
WBC: 6.2 10*3/uL (ref 4.0–10.5)
nRBC: 0 % (ref 0.0–0.2)

## 2021-10-21 LAB — BASIC METABOLIC PANEL
Anion gap: 6 (ref 5–15)
BUN: 16 mg/dL (ref 8–23)
CO2: 26 mmol/L (ref 22–32)
Calcium: 9.7 mg/dL (ref 8.9–10.3)
Chloride: 109 mmol/L (ref 98–111)
Creatinine, Ser: 1 mg/dL (ref 0.44–1.00)
GFR, Estimated: >60 mL/min (ref >60)
Glucose, Bld: 93 mg/dL (ref 70–99)
Potassium: 4.5 mmol/L (ref 3.5–5.1)
Sodium: 141 mmol/L (ref 135–145)

## 2021-10-21 NOTE — Progress Notes (Signed)
For Short Stay: Shorter appointment date: Date of COVID positive in last 17 days:  Bowel Prep reminder:   For Anesthesia: PCP - Dr. [name] Cardiologist -   Chest x-ray - 09/20/21 EKG - 07/23/21 Stress Test -  ECHO -  Cardiac Cath -  Pacemaker/ICD device last checked: Pacemaker orders received: Device Rep notified:  Spinal Cord Stimulator:  Sleep Study - Yes CPAP - NO  Fasting Blood Sugar -  Checks Blood Sugar _____ times a day Date and result of last Hgb A1c-  Blood Thinner Instructions: Aspirin Instructions: Last Dose:  Activity level: Can go up a flight of stairs and activities of daily living without stopping and without chest pain and/or shortness of breath   Able to exercise without chest pain and/or shortness of breath   Unable to go up a flight of stairs without chest pain and/or shortness of breath     Anesthesia review: Hx: HTN,OSA(NO CPAP)  Patient denies shortness of breath, fever, cough and chest pain at PAT appointment   Patient verbalized understanding of instructions that were given to them at the PAT appointment. Patient was also instructed that they will need to review over the PAT instructions again at home before surgery.

## 2021-10-27 DIAGNOSIS — H02834 Dermatochalasis of left upper eyelid: Secondary | ICD-10-CM | POA: Diagnosis not present

## 2021-10-27 DIAGNOSIS — H02831 Dermatochalasis of right upper eyelid: Secondary | ICD-10-CM | POA: Diagnosis not present

## 2021-10-27 DIAGNOSIS — H47323 Drusen of optic disc, bilateral: Secondary | ICD-10-CM | POA: Diagnosis not present

## 2021-10-27 DIAGNOSIS — H04123 Dry eye syndrome of bilateral lacrimal glands: Secondary | ICD-10-CM | POA: Diagnosis not present

## 2021-10-28 DIAGNOSIS — N3946 Mixed incontinence: Secondary | ICD-10-CM | POA: Diagnosis not present

## 2021-11-03 ENCOUNTER — Ambulatory Visit (HOSPITAL_COMMUNITY): Payer: Medicare Other | Admitting: Certified Registered Nurse Anesthetist

## 2021-11-03 ENCOUNTER — Ambulatory Visit (HOSPITAL_BASED_OUTPATIENT_CLINIC_OR_DEPARTMENT_OTHER): Payer: Medicare Other | Admitting: Certified Registered Nurse Anesthetist

## 2021-11-03 ENCOUNTER — Observation Stay (HOSPITAL_COMMUNITY)
Admission: RE | Admit: 2021-11-03 | Discharge: 2021-11-04 | Disposition: A | Discharge status: Home / Self Care | Payer: Medicare Other | Source: Ambulatory Visit | Attending: Urology | Admitting: Urology

## 2021-11-03 ENCOUNTER — Encounter (HOSPITAL_COMMUNITY)
Admission: RE | Disposition: A | Discharge status: Home / Self Care | Payer: Self-pay | Source: Ambulatory Visit | Attending: Urology

## 2021-11-03 ENCOUNTER — Other Ambulatory Visit: Payer: Self-pay

## 2021-11-03 ENCOUNTER — Encounter (HOSPITAL_COMMUNITY): Payer: Self-pay | Admitting: Urology

## 2021-11-03 DIAGNOSIS — Z79899 Other long term (current) drug therapy: Secondary | ICD-10-CM | POA: Diagnosis not present

## 2021-11-03 DIAGNOSIS — Z91040 Latex allergy status: Secondary | ICD-10-CM | POA: Diagnosis not present

## 2021-11-03 DIAGNOSIS — D4101 Neoplasm of uncertain behavior of right kidney: Secondary | ICD-10-CM | POA: Diagnosis not present

## 2021-11-03 DIAGNOSIS — N3946 Mixed incontinence: Secondary | ICD-10-CM | POA: Insufficient documentation

## 2021-11-03 DIAGNOSIS — N2889 Other specified disorders of kidney and ureter: Secondary | ICD-10-CM

## 2021-11-03 DIAGNOSIS — I10 Essential (primary) hypertension: Secondary | ICD-10-CM | POA: Insufficient documentation

## 2021-11-03 DIAGNOSIS — C641 Malignant neoplasm of right kidney, except renal pelvis: Secondary | ICD-10-CM | POA: Diagnosis not present

## 2021-11-03 HISTORY — PX: ROBOT ASSISTED LAPAROSCOPIC NEPHRECTOMY: SHX5140

## 2021-11-03 LAB — TYPE AND SCREEN
ABO/RH(D): O POS
Antibody Screen: NEGATIVE

## 2021-11-03 LAB — ABO/RH: ABO/RH(D): O POS

## 2021-11-03 SURGERY — NEPHRECTOMY, RADICAL, ROBOT-ASSISTED, LAPAROSCOPIC, ADULT
Anesthesia: General | Laterality: Right

## 2021-11-03 MED ORDER — ONDANSETRON HCL 4 MG/2ML IJ SOLN
INTRAMUSCULAR | Status: AC
  Filled 2021-11-03: qty 2

## 2021-11-03 MED ORDER — EZETIMIBE 10 MG PO TABS
10.0000 mg | ORAL_TABLET | Freq: Every day | ORAL | Status: DC
  Administered 2021-11-04: 10 mg via ORAL
  Filled 2021-11-03: qty 1

## 2021-11-03 MED ORDER — PHENYLEPHRINE HCL-NACL 20-0.9 MG/250ML-% IV SOLN
INTRAVENOUS | Status: DC | PRN
  Administered 2021-11-03: 100 ug/min via INTRAVENOUS

## 2021-11-03 MED ORDER — LIDOCAINE 2% (20 MG/ML) 5 ML SYRINGE
INTRAMUSCULAR | Status: DC | PRN
  Administered 2021-11-03: 80 mg via INTRAVENOUS

## 2021-11-03 MED ORDER — LIDOCAINE HCL (PF) 2 % IJ SOLN
INTRAMUSCULAR | Status: DC | PRN
  Administered 2021-11-03: 1 mg/kg/h via INTRADERMAL

## 2021-11-03 MED ORDER — HEMOSTATIC AGENTS (NO CHARGE) OPTIME
TOPICAL | Status: DC | PRN
  Administered 2021-11-03: 1 via TOPICAL

## 2021-11-03 MED ORDER — DEXAMETHASONE SODIUM PHOSPHATE 10 MG/ML IJ SOLN
INTRAMUSCULAR | Status: AC
  Filled 2021-11-03: qty 1

## 2021-11-03 MED ORDER — OXYCODONE HCL 5 MG PO TABS: 5.0000 mg | ORAL_TABLET | Freq: Once | ORAL | Status: DC | PRN

## 2021-11-03 MED ORDER — ROCURONIUM BROMIDE 10 MG/ML (PF) SYRINGE
PREFILLED_SYRINGE | INTRAVENOUS | Status: DC | PRN
  Administered 2021-11-03: 80 mg via INTRAVENOUS
  Administered 2021-11-03 (×3): 20 mg via INTRAVENOUS

## 2021-11-03 MED ORDER — ROCURONIUM BROMIDE 10 MG/ML (PF) SYRINGE
PREFILLED_SYRINGE | INTRAVENOUS | Status: AC
  Filled 2021-11-03: qty 10

## 2021-11-03 MED ORDER — OXYCODONE HCL 5 MG PO TABS: 10.0000 mg | ORAL_TABLET | ORAL | Status: DC | PRN

## 2021-11-03 MED ORDER — SODIUM CHLORIDE (PF) 0.9 % IJ SOLN
INTRAMUSCULAR | Status: DC | PRN
  Administered 2021-11-03: 20 mL

## 2021-11-03 MED ORDER — KETAMINE HCL 10 MG/ML IJ SOLN
INTRAMUSCULAR | Status: DC | PRN
  Administered 2021-11-03: 20 mg via INTRAVENOUS

## 2021-11-03 MED ORDER — PROMETHAZINE HCL 25 MG/ML IJ SOLN: 6.2500 mg | INTRAMUSCULAR | Status: DC | PRN

## 2021-11-03 MED ORDER — CHLORHEXIDINE GLUCONATE 0.12 % MT SOLN
15.0000 mL | Freq: Once | OROMUCOSAL | Status: AC
  Administered 2021-11-03: 15 mL via OROMUCOSAL

## 2021-11-03 MED ORDER — FENTANYL CITRATE (PF) 100 MCG/2ML IJ SOLN
INTRAMUSCULAR | Status: DC | PRN
  Administered 2021-11-03: 100 ug via INTRAVENOUS

## 2021-11-03 MED ORDER — PHENYLEPHRINE HCL (PRESSORS) 10 MG/ML IV SOLN
INTRAVENOUS | Status: AC
  Filled 2021-11-03: qty 1

## 2021-11-03 MED ORDER — BUPIVACAINE LIPOSOME 1.3 % IJ SUSP
INTRAMUSCULAR | Status: DC | PRN
  Administered 2021-11-03: 20 mL

## 2021-11-03 MED ORDER — MIDAZOLAM HCL 2 MG/2ML IJ SOLN
INTRAMUSCULAR | Status: AC
  Filled 2021-11-03: qty 2

## 2021-11-03 MED ORDER — DIPHENHYDRAMINE HCL 12.5 MG/5ML PO ELIX: 12.5000 mg | ORAL_SOLUTION | Freq: Four times a day (QID) | ORAL | Status: DC | PRN

## 2021-11-03 MED ORDER — LIDOCAINE HCL 2 % IJ SOLN
INTRAMUSCULAR | Status: AC
  Filled 2021-11-03: qty 20

## 2021-11-03 MED ORDER — MEPERIDINE HCL 50 MG/ML IJ SOLN: 6.2500 mg | INTRAMUSCULAR | Status: DC | PRN

## 2021-11-03 MED ORDER — MORPHINE SULFATE (PF) 2 MG/ML IV SOLN: 1.0000 mg | INTRAVENOUS | Status: DC | PRN

## 2021-11-03 MED ORDER — HYDROMORPHONE HCL 1 MG/ML IJ SOLN
INTRAMUSCULAR | Status: AC
  Administered 2021-11-03: 0.5 mg via INTRAVENOUS
  Filled 2021-11-03: qty 1

## 2021-11-03 MED ORDER — DIPHENHYDRAMINE HCL 50 MG/ML IJ SOLN: 12.5000 mg | Freq: Four times a day (QID) | INTRAMUSCULAR | Status: DC | PRN

## 2021-11-03 MED ORDER — OXYCODONE HCL 5 MG/5ML PO SOLN: 5.0000 mg | Freq: Once | ORAL | Status: DC | PRN

## 2021-11-03 MED ORDER — OXYCODONE HCL 5 MG PO TABS
5.0000 mg | ORAL_TABLET | ORAL | Status: DC | PRN
  Administered 2021-11-04 (×2): 5 mg via ORAL
  Filled 2021-11-03 (×2): qty 1

## 2021-11-03 MED ORDER — KETAMINE HCL 10 MG/ML IJ SOLN
INTRAMUSCULAR | Status: AC
  Filled 2021-11-03: qty 1

## 2021-11-03 MED ORDER — DEXAMETHASONE SODIUM PHOSPHATE 10 MG/ML IJ SOLN
INTRAMUSCULAR | Status: DC | PRN
  Administered 2021-11-03: 10 mg via INTRAVENOUS

## 2021-11-03 MED ORDER — ESCITALOPRAM OXALATE 10 MG PO TABS
10.0000 mg | ORAL_TABLET | Freq: Every day | ORAL | Status: DC
  Administered 2021-11-04: 10 mg via ORAL
  Filled 2021-11-03: qty 1

## 2021-11-03 MED ORDER — PHENYLEPHRINE 80 MCG/ML (10ML) SYRINGE FOR IV PUSH (FOR BLOOD PRESSURE SUPPORT)
PREFILLED_SYRINGE | INTRAVENOUS | Status: DC | PRN
  Administered 2021-11-03 (×2): 120 ug via INTRAVENOUS
  Administered 2021-11-03: 80 ug via INTRAVENOUS

## 2021-11-03 MED ORDER — AMISULPRIDE (ANTIEMETIC) 5 MG/2ML IV SOLN: 10.0000 mg | Freq: Once | INTRAVENOUS | Status: DC | PRN

## 2021-11-03 MED ORDER — ONDANSETRON HCL 4 MG/2ML IJ SOLN: 4.0000 mg | INTRAMUSCULAR | Status: DC | PRN

## 2021-11-03 MED ORDER — CEFAZOLIN SODIUM-DEXTROSE 2-4 GM/100ML-% IV SOLN
2.0000 g | Freq: Once | INTRAVENOUS | Status: AC
  Administered 2021-11-03: 2 g via INTRAVENOUS
  Filled 2021-11-03: qty 100

## 2021-11-03 MED ORDER — ORAL CARE MOUTH RINSE: 15.0000 mL | Freq: Once | OROMUCOSAL | Status: AC

## 2021-11-03 MED ORDER — BUPIVACAINE LIPOSOME 1.3 % IJ SUSP
INTRAMUSCULAR | Status: AC
  Filled 2021-11-03: qty 20

## 2021-11-03 MED ORDER — FENTANYL CITRATE (PF) 100 MCG/2ML IJ SOLN
INTRAMUSCULAR | Status: AC
  Filled 2021-11-03: qty 2

## 2021-11-03 MED ORDER — LACTATED RINGERS IV SOLN: INTRAVENOUS | Status: DC

## 2021-11-03 MED ORDER — SUGAMMADEX SODIUM 200 MG/2ML IV SOLN
INTRAVENOUS | Status: DC | PRN
  Administered 2021-11-03: 200 mg via INTRAVENOUS

## 2021-11-03 MED ORDER — LEVOTHYROXINE SODIUM 25 MCG PO TABS
25.0000 ug | ORAL_TABLET | Freq: Every day | ORAL | Status: DC
  Administered 2021-11-04: 25 ug via ORAL
  Filled 2021-11-03: qty 1

## 2021-11-03 MED ORDER — HYDROMORPHONE HCL 1 MG/ML IJ SOLN: 0.2500 mg | INTRAMUSCULAR | Status: DC | PRN

## 2021-11-03 MED ORDER — PROPOFOL 10 MG/ML IV BOLUS
INTRAVENOUS | Status: DC | PRN
  Administered 2021-11-03: 150 mg via INTRAVENOUS

## 2021-11-03 MED ORDER — MIDAZOLAM HCL 5 MG/5ML IJ SOLN
INTRAMUSCULAR | Status: DC | PRN
  Administered 2021-11-03: 2 mg via INTRAVENOUS

## 2021-11-03 MED ORDER — HYDROMORPHONE HCL 1 MG/ML IJ SOLN
0.2500 mg | INTRAMUSCULAR | Status: DC | PRN
  Administered 2021-11-03: 0.5 mg via INTRAVENOUS

## 2021-11-03 MED ORDER — SODIUM CHLORIDE (PF) 0.9 % IJ SOLN
INTRAMUSCULAR | Status: AC
  Filled 2021-11-03: qty 20

## 2021-11-03 MED ORDER — ACETAMINOPHEN 10 MG/ML IV SOLN
1000.0000 mg | Freq: Four times a day (QID) | INTRAVENOUS | Status: DC
  Administered 2021-11-03 – 2021-11-04 (×3): 1000 mg via INTRAVENOUS
  Filled 2021-11-03 (×3): qty 100

## 2021-11-03 SURGICAL SUPPLY — 83 items
ADH SKN CLS APL DERMABOND .7 (GAUZE/BANDAGES/DRESSINGS) ×1
ADH SKN CLS LQ APL DERMABOND (GAUZE/BANDAGES/DRESSINGS) ×2
AGENT HMST KT MTR STRL THRMB (HEMOSTASIS) ×1
APL ESCP 34 STRL LF DISP (HEMOSTASIS) ×1
APL LAPSCP 35 DL APL RGD (MISCELLANEOUS) ×1
APL PRP STRL LF DISP 70% ISPRP (MISCELLANEOUS) ×1
APPLICATOR SURGIFLO ENDO (HEMOSTASIS) IMPLANT
APPLICATOR VISTASEAL 35 (MISCELLANEOUS) ×1 IMPLANT
BAG COUNTER SPONGE SURGICOUNT (BAG) IMPLANT
BAG LAPAROSCOPIC 12 15 PORT 16 (BASKET) IMPLANT
BAG RETRIEVAL 12/15 (BASKET) ×1
BAG SPNG CNTER NS LX DISP (BAG)
CHLORAPREP W/TINT 26 (MISCELLANEOUS) ×1 IMPLANT
CLIP LIGATING HEM O LOK PURPLE (MISCELLANEOUS) ×1 IMPLANT
CLIP LIGATING HEMO LOK XL GOLD (MISCELLANEOUS) IMPLANT
CLIP LIGATING HEMO O LOK GREEN (MISCELLANEOUS) ×2 IMPLANT
CLIP SUT LAPRA TY ABSORB (SUTURE) ×2 IMPLANT
COVER SURGICAL LIGHT HANDLE (MISCELLANEOUS) ×1 IMPLANT
COVER TIP SHEARS 8 DVNC (MISCELLANEOUS) ×1 IMPLANT
COVER TIP SHEARS 8MM DA VINCI (MISCELLANEOUS) ×1
CUTTER ECHEON FLEX ENDO 45 340 (ENDOMECHANICALS) IMPLANT
DERMABOND ADVANCED .7 DNX12 (GAUZE/BANDAGES/DRESSINGS) ×1 IMPLANT
DERMABOND ADVANCED .7 DNX6 (GAUZE/BANDAGES/DRESSINGS) IMPLANT
DRAIN CHANNEL 15F RND FF 3/16 (WOUND CARE) IMPLANT
DRAPE ARM DVNC X/XI (DISPOSABLE) ×4 IMPLANT
DRAPE COLUMN DVNC XI (DISPOSABLE) ×1 IMPLANT
DRAPE DA VINCI XI ARM (DISPOSABLE) ×4
DRAPE DA VINCI XI COLUMN (DISPOSABLE) ×1
DRAPE INCISE IOBAN 66X45 STRL (DRAPES) ×1 IMPLANT
DRAPE SHEET LG 3/4 BI-LAMINATE (DRAPES) ×1 IMPLANT
ELECT PENCIL ROCKER SW 15FT (MISCELLANEOUS) ×1 IMPLANT
ELECT REM PT RETURN 15FT ADLT (MISCELLANEOUS) ×1 IMPLANT
EVACUATOR SILICONE 100CC (DRAIN) IMPLANT
GLOVE BIOGEL PI IND STRL 7.0 (GLOVE) IMPLANT
GLOVE BIOGEL PI IND STRL 7.5 (GLOVE) ×2 IMPLANT
GLOVE SURG SS PI 6.5 STRL IVOR (GLOVE) IMPLANT
GLOVE SURG SS PI 7.5 STRL IVOR (GLOVE) IMPLANT
GOWN SRG XL LVL 4 BRTHBL STRL (GOWNS) ×1 IMPLANT
GOWN STRL NON-REIN XL LVL4 (GOWNS) ×1
GOWN STRL REUS W/ TWL LRG LVL3 (GOWN DISPOSABLE) ×2 IMPLANT
GOWN STRL REUS W/TWL LRG LVL3 (GOWN DISPOSABLE) ×5
HEMOSTAT SURGICEL 4X8 (HEMOSTASIS) ×1 IMPLANT
HOLDER FOLEY CATH W/STRAP (MISCELLANEOUS) ×1 IMPLANT
IRRIG SUCT STRYKERFLOW 2 WTIP (MISCELLANEOUS) ×1
IRRIGATION SUCT STRKRFLW 2 WTP (MISCELLANEOUS) ×1 IMPLANT
KIT BASIN OR (CUSTOM PROCEDURE TRAY) ×1 IMPLANT
KIT TURNOVER KIT A (KITS) IMPLANT
LOOP VESSEL MAXI BLUE (MISCELLANEOUS) ×1 IMPLANT
MARKER SKIN DUAL TIP RULER LAB (MISCELLANEOUS) ×1 IMPLANT
NDL INSUFFLATION 14GA 120MM (NEEDLE) ×1 IMPLANT
NEEDLE INSUFFLATION 14GA 120MM (NEEDLE) ×1 IMPLANT
PAD POSITIONING PINK XL (MISCELLANEOUS) ×1 IMPLANT
PROTECTOR NERVE ULNAR (MISCELLANEOUS) ×2 IMPLANT
RELOAD STAPLE 45 2.6 WHT THIN (STAPLE) IMPLANT
SEAL CANN UNIV 5-8 DVNC XI (MISCELLANEOUS) ×4 IMPLANT
SEAL XI 5MM-8MM UNIVERSAL (MISCELLANEOUS) ×4
SET TUBE SMOKE EVAC HIGH FLOW (TUBING) ×1 IMPLANT
SOLUTION ELECTROLUBE (MISCELLANEOUS) ×1 IMPLANT
SPIKE FLUID TRANSFER (MISCELLANEOUS) ×1 IMPLANT
STAPLE RELOAD 45 WHT (STAPLE) ×5 IMPLANT
STAPLE RELOAD 45MM WHITE (STAPLE) ×5
SURGIFLO W/THROMBIN 8M KIT (HEMOSTASIS) IMPLANT
SUT ETHILON 3 0 PS 1 (SUTURE) ×1 IMPLANT
SUT MNCRL AB 4-0 PS2 18 (SUTURE) ×2 IMPLANT
SUT PDS AB 0 CT1 36 (SUTURE) IMPLANT
SUT PROLENE 4 0 RB 1 (SUTURE) ×1
SUT PROLENE 4-0 RB1 .5 CRCL 36 (SUTURE) ×1 IMPLANT
SUT VIC AB 2-0 SH 27 (SUTURE) ×7
SUT VIC AB 2-0 SH 27XBRD (SUTURE) ×6 IMPLANT
SUT VIC AB 3-0 SH 27 (SUTURE) ×1
SUT VIC AB 3-0 SH 27XBRD (SUTURE) IMPLANT
SUT VICRYL 0 UR6 27IN ABS (SUTURE) IMPLANT
SUT VLOC BARB 180 ABS3/0GR12 (SUTURE) ×2
SUTURE VLOC BRB 180 ABS3/0GR12 (SUTURE) ×2 IMPLANT
SYS BAG RETRIEVAL 10MM (BASKET) ×1
SYSTEM BAG RETRIEVAL 10MM (BASKET) ×1 IMPLANT
TOWEL OR 17X26 10 PK STRL BLUE (TOWEL DISPOSABLE) ×1 IMPLANT
TOWEL OR NON WOVEN STRL DISP B (DISPOSABLE) ×1 IMPLANT
TRAY FOLEY MTR SLVR 16FR STAT (SET/KITS/TRAYS/PACK) ×1 IMPLANT
TRAY LAPAROSCOPIC (CUSTOM PROCEDURE TRAY) ×1 IMPLANT
TROCAR Z THREAD OPTICAL 12X100 (TROCAR) IMPLANT
TROCAR Z-THREAD OPTICAL 5X100M (TROCAR) IMPLANT
WATER STERILE IRR 1000ML POUR (IV SOLUTION) ×1 IMPLANT

## 2021-11-03 NOTE — Anesthesia Preprocedure Evaluation (Signed)
Anesthesia Evaluation  Patient identified by MRN, date of birth, ID band Patient awake    Reviewed: Allergy & Precautions, NPO status , Patient's Chart, lab work & pertinent test results  History of Anesthesia Complications Negative for: history of anesthetic complications  Airway Mallampati: II  TM Distance: <3 FB Neck ROM: Full    Dental  (+) Dental Advisory Given, Teeth Intact   Pulmonary sleep apnea (mild, does not require CPAP) , former smoker,    breath sounds clear to auscultation       Cardiovascular hypertension, Pt. on medications (-) angina Rhythm:Regular Rate:Normal     Neuro/Psych Depression negative neurological ROS     GI/Hepatic Neg liver ROS, cholecystitis   Endo/Other  Hypothyroidism   Renal/GU negative Renal ROS     Musculoskeletal  (+) Arthritis , Osteoarthritis,    Abdominal   Peds  Hematology negative hematology ROS (+)   Anesthesia Other Findings   Reproductive/Obstetrics                             Anesthesia Physical  Anesthesia Plan  ASA: 2  Anesthesia Plan: General   Post-op Pain Management: Dilaudid IV   Induction: Intravenous  PONV Risk Score and Plan: 3 and Ondansetron, Dexamethasone, Midazolam and Treatment may vary due to age or medical condition  Airway Management Planned: Oral ETT  Additional Equipment: None  Intra-op Plan:   Post-operative Plan: Extubation in OR  Informed Consent: I have reviewed the patients History and Physical, chart, labs and discussed the procedure including the risks, benefits and alternatives for the proposed anesthesia with the patient or authorized representative who has indicated his/her understanding and acceptance.     Dental advisory given  Plan Discussed with: CRNA and Surgeon  Anesthesia Plan Comments:         Anesthesia Quick Evaluation

## 2021-11-03 NOTE — H&P (Signed)
Office Visit Report     10/28/2021   CC/HPI: Pt presents today for pre-operative history and physical exam in anticipation of right robotic assisted lap partial vs radical nephrectomy by Dr. Abner Greenspan on 11/03/21. She is doing well and is without complaint.   She is currently taking Nitrofurantoin for a UTI diagnosed by PCP.   Pt denies F/C, HA, CP, SOB, N/V, diarrhea/constipation, back pain, flank pain, hematuria, and dysuria.    HX:   Cassidy Terry is a 66 year old female who is seen in consultation today for a right renal mass.   1. Right renal mass:  -She developed right upper quadrant abdominal pain and a renal ultrasound demonstrated evidence of gallbladder disease as well as right renal mass. She underwent cholecystectomy in 07/2021.  Follow-up MRI abdomen 08/17/2021 demonstrated exophytic solid enhancing mass in the upper pole of the right kidney measuring 3.5 x 3.9 x 3.8 cm, should be considered renal cell carcinoma until proven otherwise. There is no evidence of lymphadenopathy or renal vein involvement. She had normal adrenal glands.  -She denies abdominal pain or flank pain. She denies gross hematuria.  -Creatinine 6/23 was 0.7 equating to a GFR of 94.  She denies family history of urologic malignancy.   Patient currently denies fever, chills, sweats, nausea, vomiting, abdominal or flank pain, gross hematuria or dysuria.  She has a past medical history of hypertension, hyperlipidemia, obstructive sleep apnea. She denies cardiac or pulmonary history. She underwent cholecystectomy in 07/2021. She denies anticoagulation.   They live in [place] however on a place in [place]. She is undergoing a trial at [employer].     ALLERGIES: Latex - fever blisters in mouth from dentist Sulfa Drugs - does not remember    MEDICATIONS: Levothyroxine Sodium 25 mcg capsule  Lexapro 10 mg tablet  Zetia 10 mg tablet  Zilebesiron     Notes: Amlodipine 2.'5mg'$  QD prn    GU PSH: None     PSH  Notes: Foot Surgery   NON-GU PSH: Remove Gallbladder     GU PMH: Right renal neoplasm - 09/09/2021 Mixed incontinence, Urge and stress incontinence - 2015 Nocturia, Nocturia - 2015 Urinary Frequency, Increased urinary frequency - 2015      PMH Notes: Kidney cancer.   NON-GU PMH: Anxiety, Anxiety - 2015 Encounter for general adult medical examination without abnormal findings, Encounter for preventive health examination - 2015 Arthritis GERD Hypercholesterolemia Hypertension Sleep Apnea    FAMILY HISTORY: 1 Daughter - Runs in Family 1 son - Runs in Family Hypertension - Father, Sister Kidney Stones - Sister   SOCIAL HISTORY: Marital Status: Married Preferred Language: English; Ethnicity: Not Hispanic Or Latino; Race: White Current Smoking Status: Patient does not smoke anymore.   Tobacco Use Assessment Completed: Used Tobacco in last 30 days? Does drink.  Does not use drugs. Drinks 2 caffeinated drinks per day. Has not had a blood transfusion.     Notes: ETOH wine couple glasses every 2-3 days    REVIEW OF SYSTEMS:    GU Review Female:   Patient denies frequent urination, hard to postpone urination, burning /pain with urination, get up at night to urinate, leakage of urine, stream starts and stops, trouble starting your stream, have to strain to urinate, and being pregnant.  Gastrointestinal (Upper):   Patient denies nausea, vomiting, and indigestion/ heartburn.  Gastrointestinal (Lower):   Patient denies diarrhea and constipation.  Constitutional:   Patient denies fever, night sweats, weight loss, and fatigue.  Skin:   Patient  denies skin rash/ lesion and itching.  Eyes:   Patient denies blurred vision and double vision.  Ears/ Nose/ Throat:   Patient denies sore throat and sinus problems.  Hematologic/Lymphatic:   Patient denies swollen glands and easy bruising.  Cardiovascular:   Patient denies leg swelling and chest pains.  Respiratory:   Patient denies cough and  shortness of breath.  Endocrine:   Patient denies excessive thirst.  Musculoskeletal:   Patient denies joint pain and back pain.  Neurological:   Patient denies headaches and dizziness.  Psychologic:   Patient denies depression and anxiety.   VITAL SIGNS:      10/28/2021 03:34 PM  Weight 172 lb / 78.02 kg  Height 67 in / 170.18 cm  BP 144/89 mmHg  Pulse 82 /min  Temperature 97.7 F / 36.5 C  BMI 26.9 kg/m   MULTI-SYSTEM PHYSICAL EXAMINATION:    Constitutional: Well-nourished. No physical deformities. Normally developed. Good grooming.  Neck: Neck symmetrical, not swollen. Normal tracheal position.  Respiratory: Normal breath sounds. No labored breathing, no use of accessory muscles.   Cardiovascular: Regular rate and rhythm. No murmur, no gallop.   Lymphatic: No enlargement of neck, axillae, groin.  Skin: No paleness, no jaundice, no cyanosis. No lesion, no ulcer, no rash.  Neurologic / Psychiatric: Oriented to time, oriented to place, oriented to person. No depression, no anxiety, no agitation.  Gastrointestinal: No mass, no tenderness, no rigidity, non obese abdomen.  Eyes: Normal conjunctivae. Normal eyelids.  Ears, Nose, Mouth, and Throat: Left ear no scars, no lesions, no masses. Right ear no scars, no lesions, no masses. Nose no scars, no lesions, no masses. Normal hearing. Normal lips.  Musculoskeletal: Normal gait and station of head and neck.     PAST DATA REVIEW: None   PROCEDURES:          Urinalysis w/Scope - 81001 Dipstick Dipstick Cont'd Micro  Color: Yellow Bilirubin: Neg mg/dL WBC/hpf: 0 - 5/hpf  Appearance: Slightly Cloudy Ketones: Trace mg/dL RBC/hpf: NS (Not Seen)  Specific Gravity: 1.015 Blood: Neg ery/uL Bacteria: Mod (26-50/hpf)  pH: 5.5 Protein: Neg mg/dL Cystals: NS (Not Seen)  Glucose: Neg mg/dL Urobilinogen: 0.2 mg/dL Casts: NS (Not Seen)    Nitrites: Neg Trichomonas: Not Present    Leukocyte Esterase: Neg leu/uL Mucous: Not Present      Epithelial  Cells: 0 - 5/hpf      Yeast: NS (Not Seen)      Sperm: Not Present    ASSESSMENT:      ICD-10 Details  1 GU:   Mixed incontinence - N39.46    PLAN:            Medications Stop Meds: Cholecalciferol  Discontinue: 10/28/2021  - Reason: The medication cycle was completed.  Vesicare 5 mg tablet 1 Oral  Start: 07/21/2013  Discontinue: 10/28/2021  - Reason: The medication cycle was completed.            Orders Labs Urine Culture          Schedule Return Visit/Planned Activity: Keep Scheduled Appointment - Schedule Surgery          Document Letter(s):  Created for Patient: Clinical Summary         Notes:   There are no changes in the patients history or physical exam since last evaluation by Dr. Abner Greenspan. Pt is scheduled to undergo right partial vs radical RAL nephrectomy on 11/03/21.   Check culture due to bacteria noted on UA despite being on  ABx.   All pt's questions were answered to the best of my ability.          Next Appointment:      Next Appointment: 11/03/2021 12:15 PM    Appointment Type: Surgery     Location: [facility] [phone]    Provider: Rexene Alberts, M.D.    Reason for Visit: [facility]/EXT REC RT RA LA PARTIAL NEPHRECTOMY/ POSS RAD NEPHRECTOMY WITH RESIDENT    Urology Preoperative H&P   Chief Complaint: Right renal mass  History of Present Illness: Cassidy Terry is a 66 y.o. female with a right renal mass here for robotic assisted laparoscopic right partial vs radical nephrectomy. Denies fevers, chills, dysuria.    Past Medical History:  Diagnosis Date   Arthritis    Depression    Hypertension    Hypothyroidism    Psoriasis    Sleep apnea    Mild without the use of a cpap    Past Surgical History:  Procedure Laterality Date   ABDOMINAL HYSTERECTOMY     TAH FOR HEMATOMETRIA   BELPHAROPTOSIS REPAIR     Bilateral   CARPAL TUNNEL RELEASE Left 02/24/2018   Procedure: LEFT CARPAL TUNNEL RELEASE;  Surgeon: Daryll Brod, MD;  Location:  [facility];  Service: Orthopedics;  Laterality: Left;   CESAREAN SECTION     X2   CHOLECYSTECTOMY N/A 07/20/2021   Procedure: LAPAROSCOPIC CHOLECYSTECTOMY;  Surgeon: Greer Pickerel, MD;  Location: [facility];  Service: General;  Laterality: N/A;   DILATION AND CURETTAGE OF UTERUS     X2   ENDOMETRIAL ABLATION     ENDOMYORESECTION   FOOT SURGERY  10/2010   rt. foot, 2nd toe, 1 pin to stay in   [place] Left 02/13/2020   Procedure: EXCISION SUBCUTANEOUS BUTTOCK LIPOMA;  Surgeon: Rolm Bookbinder, MD;  Location: [facility];  Service: General;  Laterality: Left;  GEN AND LMA   TONSILLECTOMY     TUBAL LIGATION     LTL-FULGURATION/TRANSECTION    Allergies:  Allergies  Allergen Reactions   Latex     Fever blisters   [place] Purpureus Went Yeast Other (See Comments)    Red Yeast Rice - myalgia    Simvastatin Other (See Comments)    Myalgias   Sulfa Antibiotics     Unknown reaction    Family History  Problem Relation Age of Onset   Hypertension Father    Vaginal cancer Mother     Social History:  reports that she has quit smoking. She has never used smokeless tobacco. She reports current alcohol use. She reports that she does not use drugs.  ROS: A complete review of systems was performed.  All systems are negative except for pertinent findings as noted.  Physical Exam:  Vital signs in last 24 hours:   Constitutional:  Alert and oriented, No acute distress Cardiovascular: Regular rate and rhythm Respiratory: Normal respiratory effort, Lungs clear bilaterally GI: Abdomen is soft, nontender, nondistended, no abdominal masses GU: No CVA tenderness Lymphatic: No lymphadenopathy Neurologic: Grossly intact, no focal deficits Psychiatric: Normal mood and affect  Laboratory Data:  No results for input(s): "WBC", "HGB", "HCT", "PLT" in the last 72 hours.  No results for input(s): "NA", "K", "CL", "GLUCOSE", "BUN", "CALCIUM", "CREATININE" in the  last 72 hours.  Invalid input(s): "CO3"   No results found for this or any previous visit (from the past 24 hour(s)). No results found for this or any previous visit (from  the past 240 hour(s)).  Renal Function: No results for input(s): "CREATININE" in the last 168 hours. Estimated Creatinine Clearance: 60 mL/min (by C-G formula based on SCr of 1 mg/dL).  Radiologic Imaging: No results found.  I independently reviewed the above imaging studies.  Assessment and Plan Cassidy Terry is a 66 y.o. female with a right renal mass here for robotic assisted laparoscopic right partial vs radical nephrectomy.    We have discussed the risks of treatment in detail including but not limited to bleeding, infection, heart attack, stroke, death, venothromoboembolism, cancer recurrence, injury/damage to surrounding organs and structures, urine leak, the possibility of open surgical conversion for patients undergoing minimally invasive surgery, the risk of developing chronic kidney disease and its associated implications, and the potential risk of end stage renal disease possibly necessitating dialysis.    Matt R. Yuki Brunsman MD 11/03/2021, 7:54 AM  [facility] Pager: [phone]

## 2021-11-03 NOTE — Anesthesia Procedure Notes (Signed)
Procedure Name: Intubation Date/Time: 11/03/2021 12:25 PM  Performed by: Gerald Leitz, CRNAPre-anesthesia Checklist: Patient identified, Patient being monitored, Timeout performed, Emergency Drugs available and Suction available Patient Re-evaluated:Patient Re-evaluated prior to induction Oxygen Delivery Method: Circle system utilized Preoxygenation: Pre-oxygenation with 100% oxygen Induction Type: IV induction Ventilation: Mask ventilation without difficulty Laryngoscope Size: Mac and 3 Grade View: Grade I Tube type: Oral Tube size: 7.5 mm Number of attempts: 1 Airway Equipment and Method: Stylet Placement Confirmation: ETT inserted through vocal cords under direct vision, positive ETCO2 and breath sounds checked- equal and bilateral Secured at: 21 cm Tube secured with: Tape Dental Injury: Teeth and Oropharynx as per pre-operative assessment  Difficulty Due To: Difficult Airway- due to anterior larynx

## 2021-11-03 NOTE — Anesthesia Postprocedure Evaluation (Signed)
Anesthesia Post Note  Patient: Cassidy Terry  Procedure(s) Performed: XI ROBOTIC ASSISTED LAPAROSCOPIC NEPHRECTOMY (Right)     Patient location during evaluation: PACU Anesthesia Type: General Level of consciousness: awake and alert Pain management: pain level controlled Vital Signs Assessment: post-procedure vital signs reviewed and stable Respiratory status: spontaneous breathing, nonlabored ventilation and respiratory function stable Cardiovascular status: blood pressure returned to baseline and stable Postop Assessment: no apparent nausea or vomiting Anesthetic complications: no   No notable events documented.  Last Vitals:  Vitals:   11/03/21 1730 11/03/21 1745  BP: (!) 108/54 102/60  Pulse: 73 75  Resp: (!) 9 (!) 9  Temp:    SpO2: 98% 95%    Last Pain:  Vitals:   11/03/21 1730  TempSrc:   PainSc: [place]

## 2021-11-03 NOTE — Op Note (Signed)
Operative Note  Preoperative diagnosis:  1.  Right renal mass  Postoperative diagnosis: 1.  Right renal mass  Procedure(s): 1.  Robotic assisted laparoscopic right radical nephrectomy (adrenal sparing)  Surgeon: Rexene Alberts, MD  Assistants:   Josph Macho, MD, PGY-4  Anesthesia:  General  Complications:  None  EBL:  27m  Specimens: 1.  ID Type Source Tests Collected by Time Destination  1 : Right kidney Tissue PATH GU biopsy SURGICAL PATHOLOGY GJanith Lima MD 11/03/2021 1546    Drains/Catheters: 1.  16 Fr foley catheter  Intraoperative findings:   Intra-abdominal adhesions with liver plastered on top of Gerotas fascia.  Approximately 4x4cm right upper posterior pole mass abutting collecting system.  1 artery and 1 vein  Indication:  Cassidy GILDENis a 66y.o. female with large a right renal mass presenting for a robotic assisted laparoscopic partial versus right radical nephrectomy.  Description of procedure: The indications, alternatives, benefits and risks were discussed with the patient and informed consent was obtained.  The patient was brought onto the operating room table, positioned supine and secured to the bed with a safety strap.  All pressure points were carefully padded and pneumatic compression devices were placed on the lower extremities.  After the administration of intravenous antibiotics and general endotracheal anesthesia, a 16 French urethral catheter was inserted to drain the bladder.  The patient was repositioned in the left lateral decubitus with the right side elevated at a 70 degree angle with the left lower leg flexed and the right upper leg extended.  An axillary roll was positioned to protect the brachial plexus and a gel pad was placed to support the back.  Multiple pillows were used to pad beneath and between the lower extremities and to ensure adequate cushioning. The right arm was placed in an armrest and carefully padded. The patient was  secured in place across the hips, chest and legs with foam padding and silk tape, and the table was flexed.  The patient was prepped and draped in the standard sterile manner.  The radiographic images were in the room.  Timeout was completed verifying the correct patient, surgical procedure, site and positioning, prior to beginning the procedure.  We attempted obtaining pneumoperitoneum with a Veress needle however the pressure was elevated. We converted to an open access approach and made an incision superior to the umbilicus and dissecting down to the fascia. The fascia was cut and a visiport was placed into the abdomen without difficulty. We insufflated with CO2 to a pressure of 15 mmHg.  The camera trocar was placed approximately 7-1/2 cm inferior and 2 cm medially to the planned position of the right robotic arm which was approximately 2 fingerbreadths inferior to the costal margin.  The 0 degree lens was inserted under direct visualization.  The abdominal cavity was examined for any signs of injury, adhesions and identification of anatomic landmarks. We then placed our right robotic trocar, left robotic trocar and fourth arm trocar.  A 12 mm assistant port was placed periumbilically and a 5 mm assistant port was placed just inferior to the xiphoid process to use as a liver retractor.  The robot was then docked.  Of note, she had significant adhesions including her entire liver plastered to the anterior surface of Gerota's fascia given her history of prior cholecystectomy. This required tedious dissection to free. The white line of Toldt was incised and the colon was reflected medially, exposing Gerota's fascia.  The hepatic flexure was mobilized.  This allowed for further retraction of the liver.  The duodenum was kocherized and the inferior vena cava was visualized.  The retroperitoneal fascia overlying the renal vessels was carefully separated, exposing the underlying renal vein.  I was able to achieve  an adequate lift using the fourth arm to elevate the kidney, further exposing the hilum.  The right renal vein and right renal artery were carefully dissected and mobilized. I then completely dissected the kidney freeing the lower pole and upper pole and rotating to gain a better visualization of the location of the right upper pole mass. Intra-operative ultrasound was performed and we identified about a 4cm x 4cm right renal mass that was abutting the collecting system and deeply endophytic. It was decided that a partial nephrectomy would likely lead to a positive margin, risk of urine leak and renal ischemia. Next, we performed a radical right nephrectomy.    A laparoscopic battery-operated stapler with a vascular load was used to control and ligate the right renal artery.  A separate vascular load was used to secure the right renal vein.  Next, a stapler was used to mobilize the upper pole and any remaining attachments to the right kidney. Special attention was given to the cranial connections to the adrenal gland, which were carefully divided using the vessel sealer, completely freeing the adrenal off the superior pole of the kidney.  The right ureter was divided between clips.   The kidney was placed in an Endo Catch bag.  The renal fossa and remainder of the operating field were inspected for bleeding or injury.  The insufflation pressure was reduced, again confirming the absence of bleeding.  Excellent hemostasis was obtained.  A mini-Gibson incision was made at the site of the fourth arm trocar.  The fascia was opened and the specimen was removed in a muscle-sparing fashion.  The ports were removed under direct vision.  Carter-Thomason device was used to close the 12 mm port.  All wounds were copiously irrigated and then infiltrated with Exparel.  The muscle was reapproximated using 0 Vicryl as a first layer. 1-0 PDS was used to close the fascia as a second layer. Vicryl was used to close the  subcuticular tissues. The skin incisions were reapproximated with 4-0 Monocryl.  Dermabond was then applied on the skin.  At the end of the procedure, all counts were correct.  The patient tolerated the procedure well and was taken to the recovery room in satisfactory condition.  [facility]  Pager: [phone]

## 2021-11-03 NOTE — Transfer of Care (Signed)
Immediate Anesthesia Transfer of Care Note  Patient: Cassidy Terry  Procedure(s) Performed: Procedure(s): XI ROBOTIC ASSISTED LAPAROSCOPIC NEPHRECTOMY (Right)  Patient Location: PACU  Anesthesia Type:General  Level of Consciousness: Alert, Awake, Oriented  Airway & Oxygen Therapy: Patient Spontanous Breathing  Post-op Assessment: Report given to RN  Post vital signs: Reviewed and stable  Last Vitals:  Vitals:   11/03/21 0954  BP: (!) 147/78  Pulse: 64  Resp: 16  Temp: 36.4 C  SpO2: 29%    Complications: No apparent anesthesia complications

## 2021-11-04 ENCOUNTER — Encounter (HOSPITAL_COMMUNITY): Payer: Self-pay | Admitting: Urology

## 2021-11-04 DIAGNOSIS — N2889 Other specified disorders of kidney and ureter: Secondary | ICD-10-CM | POA: Diagnosis present

## 2021-11-04 DIAGNOSIS — N3946 Mixed incontinence: Secondary | ICD-10-CM | POA: Diagnosis not present

## 2021-11-04 DIAGNOSIS — Z91040 Latex allergy status: Secondary | ICD-10-CM | POA: Diagnosis not present

## 2021-11-04 DIAGNOSIS — I10 Essential (primary) hypertension: Secondary | ICD-10-CM | POA: Diagnosis not present

## 2021-11-04 DIAGNOSIS — Z79899 Other long term (current) drug therapy: Secondary | ICD-10-CM | POA: Diagnosis not present

## 2021-11-04 LAB — BASIC METABOLIC PANEL
Anion gap: 5 (ref 5–15)
BUN: 17 mg/dL (ref 8–23)
CO2: 25 mmol/L (ref 22–32)
Calcium: 8.5 mg/dL — ABNORMAL LOW (ref 8.9–10.3)
Chloride: 107 mmol/L (ref 98–111)
Creatinine, Ser: 1.17 mg/dL — ABNORMAL HIGH (ref 0.44–1.00)
GFR, Estimated: 52 mL/min — ABNORMAL LOW (ref >60)
Glucose, Bld: 122 mg/dL — ABNORMAL HIGH (ref 70–99)
Potassium: 4.6 mmol/L (ref 3.5–5.1)
Sodium: 137 mmol/L (ref 135–145)

## 2021-11-04 LAB — CBC
HCT: 41.9 % (ref 36.0–46.0)
Hemoglobin: 13.7 g/dL (ref 12.0–15.0)
MCH: 31.7 pg (ref 26.0–34.0)
MCHC: 32.7 g/dL (ref 30.0–36.0)
MCV: 97 fL (ref 80.0–100.0)
Platelets: 218 10*3/uL (ref 150–400)
RBC: 4.32 MIL/uL (ref 3.87–5.11)
RDW: 13.2 % (ref 11.5–15.5)
WBC: 13.6 10*3/uL — ABNORMAL HIGH (ref 4.0–10.5)
nRBC: 0 % (ref 0.0–0.2)

## 2021-11-04 MED ORDER — ACETAMINOPHEN 500 MG PO TABS: 1000.0000 mg | ORAL_TABLET | Freq: Four times a day (QID) | ORAL | 0 refills | Status: AC

## 2021-11-04 MED ORDER — DOCUSATE SODIUM 100 MG PO CAPS: 100.0000 mg | ORAL_CAPSULE | Freq: Every day | ORAL | 0 refills | Status: AC

## 2021-11-04 MED ORDER — SENNA 8.6 MG PO TABS: 1.0000 | ORAL_TABLET | Freq: Every day | ORAL | 0 refills | Status: DC

## 2021-11-04 MED ORDER — ORAL CARE MOUTH RINSE: 15.0000 mL | OROMUCOSAL | Status: DC | PRN

## 2021-11-04 MED ORDER — OXYCODONE-ACETAMINOPHEN 5-325 MG PO TABS: 1.0000 | ORAL_TABLET | ORAL | 0 refills | Status: DC | PRN

## 2021-11-04 NOTE — Plan of Care (Signed)
  Problem: Education: Goal: Knowledge of General Education information will improve Description Including pain rating scale, medication(s)/side effects and non-pharmacologic comfort measures Outcome: Progressing   Problem: Health Behavior/Discharge Planning: Goal: Ability to manage health-related needs will improve Outcome: Progressing   

## 2021-11-04 NOTE — Discharge Summary (Signed)
Date of admission: 11/03/2021  Date of discharge: 11/04/2021  Admission diagnosis: Right renal mass  Discharge diagnosis: Right renal mass  Secondary diagnoses: None  History and Physical: For full details, please see admission history and physical. Briefly, Cassidy Terry is a 66 y.o. year old patient with a right renal mass who underwent right radical nephrectomy on 11/03/2021.[name]   Hospital Course: The patient recovered in the usual expected fashion.  She had her diet advanced slowly.  Initially managed with IV pain control, then transitioned to PO meds when she was tolerating oral intake.  Her labs were stable throughout the hospital course.  She was discharged to home on POD#1 .  At the time of discharge she was tolerating a regular diet, passing flatus, ambulating, had adequate pain control and was agreeable to discharge.  Follow up as scheduled.    Laboratory values:  Recent Labs    11/04/21 0418  HGB 13.7  HCT 41.9   Recent Labs    11/04/21 0418  CREATININE 1.17*    Disposition: Home  Discharge instruction: The patient was instructed to be ambulatory but told to refrain from heavy lifting, strenuous activity, or driving.   Discharge medications:  Allergies as of 11/04/2021       Reactions   Latex    Fever blisters   Monascus Purpureus Went Yeast Other (See Comments)   Red Yeast Rice - myalgia   Simvastatin Other (See Comments)   Myalgias   Sulfa Antibiotics    Unknown reaction        Medication List     STOP taking these medications    oxyCODONE 5 MG immediate release tablet Commonly known as: Oxy IR/ROXICODONE       TAKE these medications    acetaminophen 500 MG tablet Commonly known as: TYLENOL Take 2 tablets (1,000 mg total) by mouth every 6 (six) hours for 10 days.   amLODipine 2.5 MG tablet Commonly known as: NORVASC Take 2.5 mg by mouth daily as needed (if blood pressure elevates before receving next dose of Zilebesiran).   docusate sodium  100 MG capsule Commonly known as: Colace Take 1 capsule (100 mg total) by mouth daily for 10 days.   escitalopram 10 MG tablet Commonly known as: LEXAPRO Take 10 mg by mouth daily.   ezetimibe 10 MG tablet Commonly known as: ZETIA Take 10 mg by mouth daily.   Investigational - Study Medication Inject 3 mg into the skin every 6 (six) months. [place] Study Zilebesiran, ALN-AGT01, (IDS 2501) -eIRB [phone] injection   levothyroxine 25 MCG tablet Commonly known as: SYNTHROID Take 25 mcg by mouth daily before breakfast.   oxyCODONE-acetaminophen 5-325 MG tablet Commonly known as: Percocet Take 1 tablet by mouth every 4 (four) hours as needed for up to 20 doses for severe pain.   senna 8.6 MG Tabs tablet Commonly known as: SENOKOT Take 1 tablet (8.6 mg total) by mouth daily.   VITAMIN D-VITAMIN K PO Take 1 tablet by mouth daily.        Followup:   Follow-up Information     Janith Lima, MD Follow up.   Specialty: Urology Why: 11/10/2021 at 8:15 AM Contact information: [place] [address] [phone]                 [facility]  Pager: [phone]

## 2021-11-04 NOTE — Discharge Instructions (Signed)

## 2021-11-04 NOTE — Progress Notes (Signed)
Received report from previous nurse and will now resume care. Pt resting comfortably in bed and has no requests. Pt denies pain.

## 2021-11-04 NOTE — Progress Notes (Signed)
  Transition of Care [facility]) Screening Note   Patient Details  Name: Cassidy Terry Date of Birth: 15-May-1955   [facility]) CM/SW Contact:    Roseanne Kaufman, RN Phone Number: 11/04/2021, 12:52 PM    Transition of Care Department [facility]) has reviewed patient and no TOC needs have been identified at this time. We will continue to monitor patient advancement through interdisciplinary progression rounds. If new patient transition needs arise, please place a TOC consult.

## 2021-11-05 LAB — SURGICAL PATHOLOGY

## 2021-11-10 DIAGNOSIS — N3000 Acute cystitis without hematuria: Secondary | ICD-10-CM | POA: Diagnosis not present

## 2021-11-12 ENCOUNTER — Telehealth: Payer: Self-pay | Admitting: Oncology

## 2021-11-12 DIAGNOSIS — N390 Urinary tract infection, site not specified: Secondary | ICD-10-CM | POA: Diagnosis not present

## 2021-11-12 DIAGNOSIS — Z905 Acquired absence of kidney: Secondary | ICD-10-CM | POA: Diagnosis not present

## 2021-11-12 DIAGNOSIS — C649 Malignant neoplasm of unspecified kidney, except renal pelvis: Secondary | ICD-10-CM | POA: Diagnosis not present

## 2021-11-12 NOTE — Telephone Encounter (Signed)
Scheduled appt per 10/4 referral. Pt is aware of appt date and time. Pt is aware to arrive 15 mins prior to appt time and to bring and updated insurance card. Pt is aware of appt location.   

## 2021-11-18 DIAGNOSIS — H524 Presbyopia: Secondary | ICD-10-CM | POA: Diagnosis not present

## 2021-11-19 DIAGNOSIS — N390 Urinary tract infection, site not specified: Secondary | ICD-10-CM | POA: Diagnosis not present

## 2021-11-26 ENCOUNTER — Inpatient Hospital Stay: Payer: Medicare Other | Attending: Oncology | Admitting: Oncology

## 2021-11-26 DIAGNOSIS — E039 Hypothyroidism, unspecified: Secondary | ICD-10-CM

## 2021-11-26 DIAGNOSIS — C641 Malignant neoplasm of right kidney, except renal pelvis: Secondary | ICD-10-CM

## 2021-11-26 DIAGNOSIS — Z87891 Personal history of nicotine dependence: Secondary | ICD-10-CM

## 2021-11-26 DIAGNOSIS — I10 Essential (primary) hypertension: Secondary | ICD-10-CM | POA: Diagnosis not present

## 2021-11-26 DIAGNOSIS — C649 Malignant neoplasm of unspecified kidney, except renal pelvis: Secondary | ICD-10-CM | POA: Insufficient documentation

## 2021-11-26 NOTE — Progress Notes (Signed)
Reason for the request:    Kidney cancer  HPI: I was asked by Dr. Abner Greenspan to evaluate Cassidy Terry for the evaluation of kidney cancer.  She is a 66 year old woman who presented with abdominal pain in June of July 2023 and underwent cholecystectomy at that time.  During that evaluation she was found to have a renal mass on the right side.  MRI obtained on August 17, 2021 showed a 3.5 x 3.9 x 3.8 cm exophytic tumor of the upper pole of the right kidney.  Based on these findings she was evaluated by Dr. Abner Greenspan and underwent robotic assisted laparoscopic right radical nephrectomy on November 03, 2021.  The final pathology showed clear-cell renal cell carcinoma nuclear grade 2 measuring 3.5 cm with tumor extending into the renal vein indicating T3a disease.  Postoperatively, she reports feeling well and has fully recovered at this time.  She denies any abdominal pain or discomfort.  She denies any flank pain or hematuria.  Performance status quality of life is back to normal.  She does not report any headaches, blurry vision, syncope or seizures. Does not report any fevers, chills or sweats.  Does not report any cough, wheezing or hemoptysis.  Does not report any chest pain, palpitation, orthopnea or leg edema.  Does not report any nausea, vomiting or abdominal pain.  Does not report any constipation or diarrhea.  Does not report any skeletal complaints.    Does not report frequency, urgency or hematuria.  Does not report any skin rashes or lesions. Does not report any heat or cold intolerance.  Does not report any lymphadenopathy or petechiae.  Does not report any anxiety or depression.  Remaining review of systems is negative.     Past Medical History:  Diagnosis Date   Arthritis    Depression    Hypertension    Hypothyroidism    Psoriasis    Sleep apnea    Mild without the use of a cpap  :   Past Surgical History:  Procedure Laterality Date   ABDOMINAL HYSTERECTOMY     TAH FOR HEMATOMETRIA    BELPHAROPTOSIS REPAIR     Bilateral   CARPAL TUNNEL RELEASE Left 02/24/2018   Procedure: LEFT CARPAL TUNNEL RELEASE;  Surgeon: Daryll Brod, MD;  Location: [place];  Service: Orthopedics;  Laterality: Left;   CESAREAN SECTION     X2   CHOLECYSTECTOMY N/A 07/20/2021   Procedure: LAPAROSCOPIC CHOLECYSTECTOMY;  Surgeon: Greer Pickerel, MD;  Location: [place];  Service: General;  Laterality: N/A;   DILATION AND CURETTAGE OF UTERUS     X2   ENDOMETRIAL ABLATION     ENDOMYORESECTION   FOOT SURGERY  10/2010   rt. foot, 2nd toe, 1 pin to stay in   [place] Left 02/13/2020   Procedure: EXCISION SUBCUTANEOUS BUTTOCK LIPOMA;  Surgeon: Rolm Bookbinder, MD;  Location: [place];  Service: General;  Laterality: Left;  GEN AND LMA   ROBOT ASSISTED LAPAROSCOPIC NEPHRECTOMY Right 11/03/2021   Procedure: XI ROBOTIC ASSISTED LAPAROSCOPIC NEPHRECTOMY;  Surgeon: Janith Lima, MD;  Location: [facility];  Service: Urology;  Laterality: Right;   TONSILLECTOMY     TUBAL LIGATION     LTL-FULGURATION/TRANSECTION  :   Current Outpatient Medications:    amLODipine (NORVASC) 2.5 MG tablet, Take 2.5 mg by mouth daily as needed (if blood pressure elevates before receving next dose of Zilebesiran)., Disp: , Rfl:    escitalopram (LEXAPRO) 10 MG tablet, Take 10 mg by  mouth daily., Disp: , Rfl:    ezetimibe (ZETIA) 10 MG tablet, Take 10 mg by mouth daily., Disp: , Rfl:    Investigational - Study Medication, Inject 3 mg into the skin every 6 (six) months. [place] Study Zilebesiran, ALN-AGT01, (IDS 2501) -eIRB [phone] injection, Disp: , Rfl:    levothyroxine (SYNTHROID) 25 MCG tablet, Take 25 mcg by mouth daily before breakfast., Disp: , Rfl:    oxyCODONE-acetaminophen (PERCOCET) 5-325 MG tablet, Take 1 tablet by mouth every 4 (four) hours as needed for up to 20 doses for severe pain., Disp: 20 tablet, Rfl: 0   senna (SENOKOT) 8.6 MG TABS tablet, Take 1 tablet (8.6 mg total) by mouth daily.,  Disp: 120 tablet, Rfl: 0   VITAMIN D-VITAMIN K PO, Take 1 tablet by mouth daily., Disp: , Rfl: :   Allergies  Allergen Reactions   Latex     Fever blisters   Monascus Purpureus Went Yeast Other (See Comments)    Red Yeast Rice - myalgia    Simvastatin Other (See Comments)    Myalgias   Sulfa Antibiotics     Unknown reaction  :   Family History  Problem Relation Age of Onset   Hypertension Father    Vaginal cancer Mother   :   Social History   Socioeconomic History   Marital status: Married    Spouse name: Not on file   Number of children: Not on file   Years of education: Not on file   Highest education level: Not on file  Occupational History   Not on file  Tobacco Use   Smoking status: Former   Smokeless tobacco: Never  Vaping Use   Vaping Use: Never used  Substance and Sexual Activity   Alcohol use: Yes    Comment: occassional   Drug use: No   Sexual activity: Yes    Birth control/protection: Surgical  Other Topics Concern   Not on file  Social History Narrative   Not on file   Social Determinants of Health   Financial Resource Strain: Not on file  Food Insecurity: No Food Insecurity (11/03/2021)   Hunger Vital Sign    Worried About Running Out of Food in the Last Year: Never true    Ran Out of Food in the Last Year: Never true  Transportation Needs: No Transportation Needs (11/03/2021)   PRAPARE - Hydrologist (Medical): No    Lack of Transportation (Non-Medical): No  Physical Activity: Not on file  Stress: Not on file  Social Connections: Not on file  Intimate Partner Violence: Not At Risk (11/03/2021)   Humiliation, Afraid, Rape, and Kick questionnaire    Fear of Current or Ex-Partner: No    Emotionally Abused: No    Physically Abused: No    Sexually Abused: No  :  Pertinent items are noted in HPI.  Exam: Blood pressure (!) 121/57, pulse 67, temperature 97.8 F (36.6 C), temperature source Temporal, resp. rate  17, height '5\' 7"'$  (1.702 m), weight 166 lb 9.6 oz (75.6 kg), last menstrual period 11/06/2002, SpO2 100 %.  General appearance: alert and cooperative appeared without distress. Head: atraumatic without any abnormalities. Eyes: conjunctivae/corneas clear. PERRL.  Sclera anicteric. Throat: lips, mucosa, and tongue normal; without oral thrush or ulcers. Resp: clear to auscultation bilaterally without rhonchi, wheezes or dullness to percussion. Cardio: regular rate and rhythm, S1, S2 normal, no murmur, click, rub or gallop GI: soft, non-tender; bowel sounds normal; no masses,  no organomegaly Skin: Skin color, texture, turgor normal. No rashes or lesions Lymph nodes: Cervical, supraclavicular, and axillary nodes normal. Neurologic: Grossly normal without any motor, sensory or deep tendon reflexes. Musculoskeletal: No joint deformity or effusion.   Assessment and Plan:   70 year old with:  1.  Kidney cancer diagnosed in September 2023.  She was found to have T3a clear-cell renal cell carcinoma measuring 3.5 cm.  She had nuclear grade 3 with tumor invading into the renal veins segmental branch.  She is status post nephrectomy.   The natural course of this disease was reviewed and the role for adjuvant therapy was discussed today in detail.  Pembrolizumab 200 mg every 3 weeks for a total of 17 cycles or 1 year has been approved for this particular indication.  Complication associated with this treatment include nausea, fatigue and autoimmune considerations were discussed.  The benefit has continued to be improvement over period of time including extending progression free survival.  Alternative treatment options will be active surveillance.  After discussion today, she will consider these options and let me know in the near future.  She is interested in attending education class to learn more about Pembrolizumab.  We will arrange for that and will await her final decision  2.  IV access: Peripheral  veins will be used at this time.  3.  Autoimmune considerations: We will continue to monitor symptoms such as pneumonitis, colitis and thyroid disease.  4.  Follow-up: Will be determined pending her willingness to proceed with treatment.  60  minutes were dedicated to this visit. The time was spent on reviewing pathology results, imaging studies, discussing treatment options, and answering questions regarding future plan.      A copy of this consult has been forwarded to the requesting physician.

## 2021-12-02 ENCOUNTER — Other Ambulatory Visit: Payer: Medicare Other

## 2021-12-08 DIAGNOSIS — N3000 Acute cystitis without hematuria: Secondary | ICD-10-CM | POA: Diagnosis not present

## 2021-12-09 ENCOUNTER — Inpatient Hospital Stay: Payer: Medicare Other

## 2021-12-12 ENCOUNTER — Other Ambulatory Visit: Payer: Self-pay | Admitting: Oncology

## 2021-12-12 DIAGNOSIS — C649 Malignant neoplasm of unspecified kidney, except renal pelvis: Secondary | ICD-10-CM

## 2021-12-12 NOTE — Progress Notes (Signed)
START ON PATHWAY REGIMEN - Renal Cell     A cycle is every 21 days:     Pembrolizumab   **Always confirm dose/schedule in your pharmacy ordering system**  Patient Characteristics: Postoperative without Neoadjuvant Therapy, M0 (Pathologic Staging), Stage I, II, III, or Resected T4M0 Stage IV, Intermediate-High Risk Therapeutic Status: Postoperative without Neoadjuvant Therapy, M0 (Pathologic Staging) AJCC M Category: cM0 AJCC 8 Stage Grouping: III AJCC T Category: pT3a AJCC N Category: pN0 Risk Status: Intermediate-High Risk Intent of Therapy: Curative Intent, Discussed with Patient

## 2021-12-16 ENCOUNTER — Other Ambulatory Visit: Payer: Self-pay

## 2021-12-16 NOTE — Progress Notes (Signed)
Pharmacist Chemotherapy Monitoring - Initial Assessment    Anticipated start date: 12/23/21   The following has been reviewed per standard work regarding the patient's treatment regimen: The patient's diagnosis, treatment plan and drug doses, and organ/hematologic function Lab orders and baseline tests specific to treatment regimen  The treatment plan start date, drug sequencing, and pre-medications Prior authorization status  Patient's documented medication list, including drug-drug interaction screen and prescriptions for anti-emetics and supportive care specific to the treatment regimen The drug concentrations, fluid compatibility, administration routes, and timing of the medications to be used The patient's access for treatment and lifetime cumulative dose history, if applicable  The patient's medication allergies and previous infusion related reactions, if applicable   Changes made to treatment plan:  N/A  Follow up needed:  Pending authorization for treatment    Larene Beach, [place], 12/16/2021  10:34 AM ]

## 2021-12-18 DIAGNOSIS — L509 Urticaria, unspecified: Secondary | ICD-10-CM | POA: Diagnosis not present

## 2021-12-18 DIAGNOSIS — Z85828 Personal history of other malignant neoplasm of skin: Secondary | ICD-10-CM | POA: Diagnosis not present

## 2021-12-18 DIAGNOSIS — L503 Dermatographic urticaria: Secondary | ICD-10-CM | POA: Diagnosis not present

## 2021-12-18 DIAGNOSIS — L821 Other seborrheic keratosis: Secondary | ICD-10-CM | POA: Diagnosis not present

## 2021-12-23 ENCOUNTER — Inpatient Hospital Stay: Payer: Medicare Other

## 2021-12-23 ENCOUNTER — Inpatient Hospital Stay: Payer: Medicare Other | Attending: Oncology

## 2021-12-23 VITALS — BP 142/75 | HR 65 | Temp 97.6°F | Resp 18 | Ht 67.0 in | Wt 169.0 lb

## 2021-12-23 DIAGNOSIS — Z5112 Encounter for antineoplastic immunotherapy: Secondary | ICD-10-CM | POA: Insufficient documentation

## 2021-12-23 DIAGNOSIS — C642 Malignant neoplasm of left kidney, except renal pelvis: Secondary | ICD-10-CM | POA: Insufficient documentation

## 2021-12-23 DIAGNOSIS — Z79899 Other long term (current) drug therapy: Secondary | ICD-10-CM | POA: Diagnosis not present

## 2021-12-23 DIAGNOSIS — C649 Malignant neoplasm of unspecified kidney, except renal pelvis: Secondary | ICD-10-CM

## 2021-12-23 LAB — CBC WITH DIFFERENTIAL (CANCER CENTER ONLY)
Abs Immature Granulocytes: 0.01 10*3/uL (ref 0.00–0.07)
Basophils Absolute: 0 10*3/uL (ref 0.0–0.1)
Basophils Relative: 1 %
Eosinophils Absolute: 0.2 10*3/uL (ref 0.0–0.5)
Eosinophils Relative: 3 %
HCT: 40.2 % (ref 36.0–46.0)
Hemoglobin: 13.3 g/dL (ref 12.0–15.0)
Immature Granulocytes: 0 %
Lymphocytes Relative: 30 %
Lymphs Abs: 1.7 10*3/uL (ref 0.7–4.0)
MCH: 31.4 pg (ref 26.0–34.0)
MCHC: 33.1 g/dL (ref 30.0–36.0)
MCV: 95 fL (ref 80.0–100.0)
Monocytes Absolute: 0.4 10*3/uL (ref 0.1–1.0)
Monocytes Relative: 7 %
Neutro Abs: 3.5 10*3/uL (ref 1.7–7.7)
Neutrophils Relative %: 59 %
Platelet Count: 241 10*3/uL (ref 150–400)
RBC: 4.23 MIL/uL (ref 3.87–5.11)
RDW: 12.7 % (ref 11.5–15.5)
WBC Count: 5.8 10*3/uL (ref 4.0–10.5)
nRBC: 0 % (ref 0.0–0.2)

## 2021-12-23 LAB — CMP (CANCER CENTER ONLY)
ALT: 20 U/L (ref 0–44)
AST: 21 U/L (ref 15–41)
Albumin: 4.1 g/dL (ref 3.5–5.0)
Alkaline Phosphatase: 77 U/L (ref 38–126)
Anion gap: 5 (ref 5–15)
BUN: 16 mg/dL (ref 8–23)
CO2: 30 mmol/L (ref 22–32)
Calcium: 9.2 mg/dL (ref 8.9–10.3)
Chloride: 106 mmol/L (ref 98–111)
Creatinine: 1.15 mg/dL — ABNORMAL HIGH (ref 0.44–1.00)
GFR, Estimated: 53 mL/min — ABNORMAL LOW (ref >60)
Glucose, Bld: 78 mg/dL (ref 70–99)
Potassium: 3.8 mmol/L (ref 3.5–5.1)
Sodium: 141 mmol/L (ref 135–145)
Total Bilirubin: 0.4 mg/dL (ref 0.3–1.2)
Total Protein: 6.9 g/dL (ref 6.5–8.1)

## 2021-12-23 LAB — TSH: TSH: 1.921 u[IU]/mL (ref 0.350–4.500)

## 2021-12-23 MED ORDER — SODIUM CHLORIDE 0.9 % IV SOLN
200.0000 mg | Freq: Once | INTRAVENOUS | Status: AC
  Administered 2021-12-23: 200 mg via INTRAVENOUS
  Filled 2021-12-23: qty 200

## 2021-12-23 MED ORDER — SODIUM CHLORIDE 0.9 % IV SOLN: Freq: Once | INTRAVENOUS | Status: AC

## 2021-12-23 NOTE — Patient Instructions (Signed)
[facility]  Discharge Instructions: Thank you for choosing [facility] to provide your oncology and hematology care.   If you have a lab appointment with the [facility], please go directly to the [facility] and check in at the registration area.   Wear comfortable clothing and clothing appropriate for easy access to any Portacath or PICC line.   We strive to give you quality time with your provider. You may need to reschedule your appointment if you arrive late (15 or more minutes).  Arriving late affects you and other patients whose appointments are after yours.  Also, if you miss three or more appointments without notifying the office, you may be dismissed from the clinic at the provider's discretion.      For prescription refill requests, have your pharmacy contact our office and allow 72 hours for refills to be completed.    Today you received the following chemotherapy and/or immunotherapy agents  Pembrolizumab Injection (Keytruda)      To help prevent nausea and vomiting after your treatment, we encourage you to take your nausea medication as directed.  BELOW ARE SYMPTOMS THAT SHOULD BE REPORTED IMMEDIATELY: *FEVER GREATER THAN 100.4 F (38 C) OR HIGHER *CHILLS OR SWEATING *NAUSEA AND VOMITING THAT IS NOT CONTROLLED WITH YOUR NAUSEA MEDICATION *UNUSUAL SHORTNESS OF BREATH *UNUSUAL BRUISING OR BLEEDING *URINARY PROBLEMS (pain or burning when urinating, or frequent urination) *BOWEL PROBLEMS (unusual diarrhea, constipation, pain near the anus) TENDERNESS IN MOUTH AND THROAT WITH OR WITHOUT PRESENCE OF ULCERS (sore throat, sores in mouth, or a toothache) UNUSUAL RASH, SWELLING OR PAIN  UNUSUAL VAGINAL DISCHARGE OR ITCHING   Items with * indicate a potential emergency and should be followed up as soon as possible or go to the Emergency Department if any problems should occur.  Please show the CHEMOTHERAPY ALERT CARD or IMMUNOTHERAPY  ALERT CARD at check-in to the Emergency Department and triage nurse.  Should you have questions after your visit or need to cancel or reschedule your appointment, please contact [facility]  Dept: [phone]  and follow the prompts.  Office hours are 8:00 a.m. to 4:30 p.m. Monday - Friday. Please note that voicemails left after 4:00 p.m. may not be returned until the following business day.  We are closed weekends and major holidays. You have access to a nurse at all times for urgent questions. Please call the main number to the clinic Dept: [phone] and follow the prompts.   For any non-urgent questions, you may also contact your provider using MyChart. We now offer e-Visits for anyone 16 and older to request care online for non-urgent symptoms. For details visit [email or web address].   Also download the MyChart app! Go to the app store, search "MyChart", open the app, select [facility], and log in with your MyChart username and password.  Masks are optional in the cancer centers. If you would like for your care team to wear a mask while they are taking care of you, please let them know. You may have one support person who is at least 66 years old accompany you for your appointments.  Pembrolizumab Injection Beryle Flock) What is this medication? PEMBROLIZUMAB (PEM broe LIZ ue mab) treats some types of cancer. It works by helping your immune system slow or stop the spread of cancer cells. It is a monoclonal antibody. This medicine may be used for other purposes; ask your health care provider or pharmacist if you have questions. COMMON  BRAND NAME(S): Keytruda What should I tell my care team before I take this medication? They need to know if you have any of these conditions: Allogeneic stem cell transplant (uses someone else's stem cells) Autoimmune diseases, such as Crohn disease, ulcerative colitis, lupus History of chest radiation Nervous system problems,  such as Guillain-Barre syndrome, myasthenia gravis Organ transplant An unusual or allergic reaction to pembrolizumab, other medications, foods, dyes, or preservatives Pregnant or trying to get pregnant Breast-feeding How should I use this medication? This medication is injected into a vein. It is given by your care team in a hospital or clinic setting. A special MedGuide will be given to you before each treatment. Be sure to read this information carefully each time. Talk to your care team about the use of this medication in children. While it may be prescribed for children as young as 6 months for selected conditions, precautions do apply. Overdosage: If you think you have taken too much of this medicine contact a poison control center or emergency room at once. NOTE: This medicine is only for you. Do not share this medicine with others. What if I miss a dose? Keep appointments for follow-up doses. It is important not to miss your dose. Call your care team if you are unable to keep an appointment. What may interact with this medication? Interactions have not been studied. This list may not describe all possible interactions. Give your health care provider a list of all the medicines, herbs, non-prescription drugs, or dietary supplements you use. Also tell them if you smoke, drink alcohol, or use illegal drugs. Some items may interact with your medicine. What should I watch for while using this medication? Your condition will be monitored carefully while you are receiving this medication. You may need blood work while taking this medication. This medication may cause serious skin reactions. They can happen weeks to months after starting the medication. Contact your care team right away if you notice fevers or flu-like symptoms with a rash. The rash may be red or purple and then turn into blisters or peeling of the skin. You may also notice a red rash with swelling of the face, lips, or lymph nodes  in your neck or under your arms. Tell your care team right away if you have any change in your eyesight. Talk to your care team if you may be pregnant. Serious birth defects can occur if you take this medication during pregnancy and for 4 months after the last dose. You will need a negative pregnancy test before starting this medication. Contraception is recommended while taking this medication and for 4 months after the last dose. Your care team can help you find the option that works for you. Do not breastfeed while taking this medication and for 4 months after the last dose. What side effects may I notice from receiving this medication? Side effects that you should report to your care team as soon as possible: Allergic reactions--skin rash, itching, hives, swelling of the face, lips, tongue, or throat Dry cough, shortness of breath or trouble breathing Eye pain, redness, irritation, or discharge with blurry or decreased vision Heart muscle inflammation--unusual weakness or fatigue, shortness of breath, chest pain, fast or irregular heartbeat, dizziness, swelling of the ankles, feet, or hands Hormone gland problems--headache, sensitivity to light, unusual weakness or fatigue, dizziness, fast or irregular heartbeat, increased sensitivity to cold or heat, excessive sweating, constipation, hair loss, increased thirst or amount of urine, tremors or shaking, irritability Infusion reactions--chest  pain, shortness of breath or trouble breathing, feeling faint or lightheaded Kidney injury (glomerulonephritis)--decrease in the amount of urine, red or dark brown urine, foamy or bubbly urine, swelling of the ankles, hands, or feet Liver injury--right upper belly pain, loss of appetite, nausea, light-colored stool, dark yellow or brown urine, yellowing skin or eyes, unusual weakness or fatigue Pain, tingling, or numbness in the hands or feet, muscle weakness, change in vision, confusion or trouble speaking, loss  of balance or coordination, trouble walking, seizures Rash, fever, and swollen lymph nodes Redness, blistering, peeling, or loosening of the skin, including inside the mouth Sudden or severe stomach pain, bloody diarrhea, fever, nausea, vomiting Side effects that usually do not require medical attention (report to your care team if they continue or are bothersome): Bone, joint, or muscle pain Diarrhea Fatigue Loss of appetite Nausea Skin rash This list may not describe all possible side effects. Call your doctor for medical advice about side effects. You may report side effects to FDA at 1-800-FDA-1088. Where should I keep my medication? This medication is given in a hospital or clinic. It will not be stored at home. NOTE: This sheet is a summary. It may not cover all possible information. If you have questions about this medicine, talk to your doctor, pharmacist, or health care provider.  2023 Elsevier/Gold Standard (2012-10-17 00:00:00)

## 2021-12-24 ENCOUNTER — Telehealth: Payer: Self-pay | Admitting: *Deleted

## 2021-12-24 LAB — T4: T4, Total: 6.3 ug/dL (ref 4.5–12.0)

## 2021-12-24 NOTE — Telephone Encounter (Signed)
Cassidy Terry for treatment follow up. States she did very well, slept good last night, eating and drinking without any problems. No N/V.  No questions or concerns

## 2021-12-25 ENCOUNTER — Other Ambulatory Visit: Payer: Self-pay

## 2021-12-27 ENCOUNTER — Other Ambulatory Visit: Payer: Self-pay

## 2021-12-29 ENCOUNTER — Other Ambulatory Visit: Payer: Self-pay

## 2022-01-01 ENCOUNTER — Other Ambulatory Visit: Payer: Self-pay

## 2022-01-06 ENCOUNTER — Encounter: Payer: Self-pay | Admitting: Genetic Counselor

## 2022-01-06 ENCOUNTER — Other Ambulatory Visit: Payer: Self-pay | Admitting: Genetic Counselor

## 2022-01-06 ENCOUNTER — Telehealth: Payer: Self-pay | Admitting: Genetic Counselor

## 2022-01-06 DIAGNOSIS — Z8481 Family history of carrier of genetic disease: Secondary | ICD-10-CM

## 2022-01-06 NOTE — Telephone Encounter (Signed)
Patient called to schedule genetic counseling appt given her sister's mutation in ATM gene.  Scheduled 01/13/2022.  Patient prefers to be seen during infusion.

## 2022-01-13 ENCOUNTER — Inpatient Hospital Stay: Payer: Medicare Other

## 2022-01-13 ENCOUNTER — Inpatient Hospital Stay: Payer: Medicare Other | Attending: Oncology | Admitting: Oncology

## 2022-01-13 ENCOUNTER — Inpatient Hospital Stay (HOSPITAL_BASED_OUTPATIENT_CLINIC_OR_DEPARTMENT_OTHER): Payer: Medicare Other | Admitting: Genetic Counselor

## 2022-01-13 ENCOUNTER — Encounter: Payer: Self-pay | Admitting: Genetic Counselor

## 2022-01-13 DIAGNOSIS — Z8051 Family history of malignant neoplasm of kidney: Secondary | ICD-10-CM

## 2022-01-13 DIAGNOSIS — C649 Malignant neoplasm of unspecified kidney, except renal pelvis: Secondary | ICD-10-CM

## 2022-01-13 DIAGNOSIS — Z8481 Family history of carrier of genetic disease: Secondary | ICD-10-CM

## 2022-01-13 DIAGNOSIS — Z5112 Encounter for antineoplastic immunotherapy: Secondary | ICD-10-CM | POA: Diagnosis not present

## 2022-01-13 DIAGNOSIS — Z79899 Other long term (current) drug therapy: Secondary | ICD-10-CM | POA: Diagnosis not present

## 2022-01-13 DIAGNOSIS — Z803 Family history of malignant neoplasm of breast: Secondary | ICD-10-CM | POA: Diagnosis not present

## 2022-01-13 LAB — CBC WITH DIFFERENTIAL (CANCER CENTER ONLY)
Abs Immature Granulocytes: 0.03 10*3/uL (ref 0.00–0.07)
Basophils Absolute: 0 10*3/uL (ref 0.0–0.1)
Basophils Relative: 1 %
Eosinophils Absolute: 0.2 10*3/uL (ref 0.0–0.5)
Eosinophils Relative: 4 %
HCT: 40.6 % (ref 36.0–46.0)
Hemoglobin: 13.6 g/dL (ref 12.0–15.0)
Immature Granulocytes: 1 %
Lymphocytes Relative: 27 %
Lymphs Abs: 1.7 10*3/uL (ref 0.7–4.0)
MCH: 31.6 pg (ref 26.0–34.0)
MCHC: 33.5 g/dL (ref 30.0–36.0)
MCV: 94.2 fL (ref 80.0–100.0)
Monocytes Absolute: 0.5 10*3/uL (ref 0.1–1.0)
Monocytes Relative: 8 %
Neutro Abs: 3.8 10*3/uL (ref 1.7–7.7)
Neutrophils Relative %: 59 %
Platelet Count: 219 10*3/uL (ref 150–400)
RBC: 4.31 MIL/uL (ref 3.87–5.11)
RDW: 12.5 % (ref 11.5–15.5)
WBC Count: 6.3 10*3/uL (ref 4.0–10.5)
nRBC: 0 % (ref 0.0–0.2)

## 2022-01-13 LAB — CMP (CANCER CENTER ONLY)
ALT: 24 U/L (ref 0–44)
AST: 24 U/L (ref 15–41)
Albumin: 3.7 g/dL (ref 3.5–5.0)
Alkaline Phosphatase: 68 U/L (ref 38–126)
Anion gap: 5 (ref 5–15)
BUN: 18 mg/dL (ref 8–23)
CO2: 29 mmol/L (ref 22–32)
Calcium: 9.2 mg/dL (ref 8.9–10.3)
Chloride: 107 mmol/L (ref 98–111)
Creatinine: 1.01 mg/dL — ABNORMAL HIGH (ref 0.44–1.00)
GFR, Estimated: >60 mL/min (ref >60)
Glucose, Bld: 89 mg/dL (ref 70–99)
Potassium: 3.7 mmol/L (ref 3.5–5.1)
Sodium: 141 mmol/L (ref 135–145)
Total Bilirubin: 0.4 mg/dL (ref 0.3–1.2)
Total Protein: 6.4 g/dL — ABNORMAL LOW (ref 6.5–8.1)

## 2022-01-13 LAB — GENETIC SCREENING ORDER

## 2022-01-13 MED ORDER — SODIUM CHLORIDE 0.9 % IV SOLN
200.0000 mg | Freq: Once | INTRAVENOUS | Status: AC
  Administered 2022-01-13: 200 mg via INTRAVENOUS
  Filled 2022-01-13: qty 200

## 2022-01-13 MED ORDER — SODIUM CHLORIDE 0.9 % IV SOLN: Freq: Once | INTRAVENOUS | Status: AC

## 2022-01-13 NOTE — Patient Instructions (Signed)
[facility]  Discharge Instructions: Thank you for choosing [facility] to provide your oncology and hematology care.   If you have a lab appointment with the Cancer Center, please go directly to the Cancer Center and check in at the registration area.   Wear comfortable clothing and clothing appropriate for easy access to any Portacath or PICC line.   We strive to give you quality time with your provider. You may need to reschedule your appointment if you arrive late (15 or more minutes).  Arriving late affects you and other patients whose appointments are after yours.  Also, if you miss three or more appointments without notifying the office, you may be dismissed from the clinic at the provider's discretion.      For prescription refill requests, have your pharmacy contact our office and allow 72 hours for refills to be completed.    Today you received the following chemotherapy and/or immunotherapy agents: Keytruda.       To help prevent nausea and vomiting after your treatment, we encourage you to take your nausea medication as directed.  BELOW ARE SYMPTOMS THAT SHOULD BE REPORTED IMMEDIATELY: *FEVER GREATER THAN 100.4 F (38 C) OR HIGHER *CHILLS OR SWEATING *NAUSEA AND VOMITING THAT IS NOT CONTROLLED WITH YOUR NAUSEA MEDICATION *UNUSUAL SHORTNESS OF BREATH *UNUSUAL BRUISING OR BLEEDING *URINARY PROBLEMS (pain or burning when urinating, or frequent urination) *BOWEL PROBLEMS (unusual diarrhea, constipation, pain near the anus) TENDERNESS IN MOUTH AND THROAT WITH OR WITHOUT PRESENCE OF ULCERS (sore throat, sores in mouth, or a toothache) UNUSUAL RASH, SWELLING OR PAIN  UNUSUAL VAGINAL DISCHARGE OR ITCHING   Items with * indicate a potential emergency and should be followed up as soon as possible or go to the Emergency Department if any problems should occur.  Please show the CHEMOTHERAPY ALERT CARD or IMMUNOTHERAPY ALERT CARD at check-in to  the Emergency Department and triage nurse.  Should you have questions after your visit or need to cancel or reschedule your appointment, please contact [facility]  Dept: [phone]  and follow the prompts.  Office hours are 8:00 a.m. to 4:30 p.m. Monday - Friday. Please note that voicemails left after 4:00 p.m. may not be returned until the following business day.  We are closed weekends and major holidays. You have access to a nurse at all times for urgent questions. Please call the main number to the clinic Dept: [phone] and follow the prompts.   For any non-urgent questions, you may also contact your provider using MyChart. We now offer e-Visits for anyone 18 and older to request care online for non-urgent symptoms. For details visit [email or web address].   Also download the MyChart app! Go to the app store, search "MyChart", open the app, select [facility], and log in with your MyChart username and password.  Masks are optional in the cancer centers. If you would like for your care team to wear a mask while they are taking care of you, please let them know. You may have one support person who is at least 66 years old accompany you for your appointments. 

## 2022-01-13 NOTE — Progress Notes (Signed)
REFERRING PROVIDER: Wyatt Portela, MD [place],  [place] [address]  PRIMARY PROVIDER:  [name], MD  PRIMARY REASON FOR VISIT:  1. Malignant neoplasm of kidney, unspecified laterality ([place])   2. Family history of gene mutation   3. Family history of kidney cancer   4. Family history of breast cancer     HISTORY OF PRESENT ILLNESS:   [name], a 66 y.o. female, was seen for a [facility] cancer genetics consultation due to a family history of an ATM gene mutation in her sister.  Cassidy Terry presents to clinic today to discuss the possibility of a hereditary predisposition to cancer, to discuss genetic testing, and to further clarify her future cancer risks, as well as potential cancer risks for family members.   In 2023, at the age of 72, Cassidy Terry was diagnosed with clear cell renal cell carcinoma.  She also has a history of basal cell carcinoma removed from her chest at the age of 68, and a lipoma of her buttock removed at age 50.     CANCER HISTORY:  Oncology History  Cancer of kidney ([facility])  11/26/2021 Initial Diagnosis   Cancer of kidney ([facility])   11/26/2021 Cancer Staging   Staging form: Kidney, AJCC 8th Edition - Clinical: Stage III (cT3a, cN0, cM0) - Signed by Wyatt Portela, MD on 11/26/2021   12/23/2021 -  Chemotherapy   Patient is on Treatment Plan : BLADDER Pembrolizumab (200) q21d        RISK FACTORS:  Hysterectomy at age 24--adenomyosis.  One ovary remains per patient.   Past Medical History:  Diagnosis Date   Arthritis    Depression    Hypertension    Hypothyroidism    Psoriasis    Sleep apnea    Mild without the use of a cpap    Past Surgical History:  Procedure Laterality Date   ABDOMINAL HYSTERECTOMY     TAH FOR HEMATOMETRIA   BELPHAROPTOSIS REPAIR     Bilateral   CARPAL TUNNEL RELEASE Left 02/24/2018   Procedure: LEFT CARPAL TUNNEL RELEASE;  Surgeon: Daryll Brod, MD;  Location: [facility];  Service:  Orthopedics;  Laterality: Left;   CESAREAN SECTION     X2   CHOLECYSTECTOMY N/A 07/20/2021   Procedure: LAPAROSCOPIC CHOLECYSTECTOMY;  Surgeon: Greer Pickerel, MD;  Location: [facility];  Service: General;  Laterality: N/A;   DILATION AND CURETTAGE OF UTERUS     X2   ENDOMETRIAL ABLATION     ENDOMYORESECTION   FOOT SURGERY  10/2010   rt. foot, 2nd toe, 1 pin to stay in   [place] Left 02/13/2020   Procedure: EXCISION SUBCUTANEOUS BUTTOCK LIPOMA;  Surgeon: Rolm Bookbinder, MD;  Location: [facility];  Service: General;  Laterality: Left;  GEN AND LMA   ROBOT ASSISTED LAPAROSCOPIC NEPHRECTOMY Right 11/03/2021   Procedure: XI ROBOTIC ASSISTED LAPAROSCOPIC NEPHRECTOMY;  Surgeon: Janith Lima, MD;  Location: [facility];  Service: Urology;  Laterality: Right;   TONSILLECTOMY     TUBAL LIGATION     LTL-FULGURATION/TRANSECTION    FAMILY HISTORY:  We obtained a detailed, 4-generation family history.  Significant diagnoses are listed below: Family History  Problem Relation Age of Onset   Vaginal cancer Mother        dx after 51   Hypertension Father    Breast cancer Sister 34       ATM gene mutation   Breast cancer Maternal Grandmother  dx before 50?   Kidney cancer Maternal Grandmother        dx before 71?   Cancer Paternal Grandmother        unknown type; dx after 75     Cassidy Terry's sister, Cassidy Terry, had a pathogenic variant detected in ATM called  [id number].  No other deleterious variants were detected in her testing through [facility] +RNAinsight Panel.  Report date is 12/16/2021.    Ms. Bolt is unaware of other previous family history of genetic testing for hereditary cancer risks. There is no reported Ashkenazi Jewish ancestry. There is no known consanguinity.  GENETIC COUNSELING ASSESSMENT: [name] is a 66 y.o. female with a family history of a known ATM mutation and a personal and family history of kidney cancer that suggests a possible   additional predisposition to cancer. We, therefore, discussed and recommended the following at today's visit.   DISCUSSION: We discussed that 5 - 10% of cancer is hereditary.  Given her sister, Cassidy Terry, has a mutation in the ATM gene, she has a 50% chance of having the same family mutation in [place].  We reviewed the breast, ovarian, and pancreatic cancer risks and management strategies associated with ATM mutations. There are other genes that can be associated with hereditary kidney and breast cancer syndromes..  We discussed that testing is beneficial for several reasons including knowing how to follow individuals for their cancer risks and understanding if other family members could be at risk for cancer and allowing them to undergo genetic testing.   We reviewed the characteristics, features and inheritance patterns of hereditary cancer syndromes. We also discussed genetic testing, including the appropriate family members to test, the process of testing, insurance coverage and turn-around-time for results. We discussed the implications of a negative, positive, carrier and/or variant of uncertain significant result. We recommended [name] pursue genetic testing for a panel that includes ATM and genes associated with breast and kidney cancer.   The CancerNext-Expanded gene panel offered by [facility] and includes sequencing, rearrangement, and RNA analysis for the following 77 genes: AIP, ALK, APC, ATM, AXIN2, BAP1, BARD1, BLM, BMPR1A, BRCA1, BRCA2, BRIP1, CDC73, CDH1, CDK4, CDKN1B, CDKN2A, CHEK2, CTNNA1, DICER1, FANCC, FH, FLCN, GALNT12, KIF1B, LZTR1, MAX, MEN1, MET, MLH1, MSH2, MSH3, MSH6, MUTYH, NBN, NF1, NF2, NTHL1, PALB2, PHOX2B, PMS2, POT1, PRKAR1A, PTCH1, PTEN, RAD51C, RAD51D, RB1, RECQL, RET, SDHA, SDHAF2, SDHB, SDHC, SDHD, SMAD4, SMARCA4, SMARCB1, SMARCE1, STK11, SUFU, TMEM127, TP53, TSC1, TSC2, VHL and XRCC2 (sequencing and deletion/duplication); EGFR, EGLN1, HOXB13, KIT, MITF, PDGFRA, POLD1,  and POLE (sequencing only); EPCAM and GREM1 (deletion/duplication only).   Based on [name]'s family history of an ATM and personal and family history of kidney cancer, she meets medical criteria for genetic testing. Despite that she meets criteria, she may still have an out of pocket cost. We discussed that if her out of pocket cost for testing is over $100, the laboratory should contact her and discuss the self-pay prices and/or patient pay assistance programs.       PLAN: After considering the risks, benefits, and limitations, Ms. Dobie provided informed consent to pursue genetic testing and the blood sample was sent to [facility] for analysis of the CancerNext-Expanded +RNAinsight Panel. Results should be available within approximately 3 weeks' time, at which point they will be disclosed by telephone to Ms. Larocque, as will any additional recommendations warranted by these results. Ms. Salonga will receive a summary of her genetic counseling visit and a copy of her results  once available. This information will also be available in Epic.    Ms. Bertucci's questions were answered to her satisfaction today. Our contact information was provided should additional questions or concerns arise. Thank you for the referral and allowing [place] to share in the care of your patient.   Claiborne Stroble M. Joette Catching, [place], [facility] Genetic Counselor [email or web address] (P) [phone]  The patient was seen for a total of 28 minutes in face-to-face genetic counseling.  The patient was seen alone.  Drs. Lindi Adie and/or Burr Medico were available to discuss this case as needed.    _______________________________________________________________________ For Office Staff:  Number of people involved in session: 1 Was an Intern/ student involved with case: no

## 2022-01-13 NOTE — Progress Notes (Signed)
Hematology and Oncology Follow Up Visit  Cassidy Terry [id number] 09-06-1955 66 y.o. 01/13/2022 8:27 AM Cassidy Terry, MDShadad, Cassidy Dad, MD   Principle Diagnosis: 50 year old woman with T3a clear-cell renal cell carcinoma diagnosed in September 2023.   Prior Therapy: She is  S/P robotic assisted laparoscopic right radical nephrectomy on November 03, 2021. The final pathology showed clear-cell renal cell carcinoma nuclear grade 2 measuring 3.5 cm with tumor extending into the renal vein indicating T3a disease.   Current therapy: Pembrolizumab 200 mg every 3 weeks started on December 23, 2021.  She is here for cycle 2 of therapy and the plan is to complete 17 cycles or close to 1 year.  Interim History: Cassidy Terry returns today for a follow-up visit.  Since the last visit, she received the first cycle of pembrolizumab without any major complications.  She denies any nausea, vomiting or abdominal pain.  She denies any hospitalizations or illnesses.  Her performance status quality of life remains unchanged.  She denies any exacerbation of her psoriasis.     Medications: I have reviewed the patient's current medications.  Current Outpatient Medications  Medication Sig Dispense Refill   amLODipine (NORVASC) 2.5 MG tablet Take 2.5 mg by mouth daily as needed (if blood pressure elevates before receving next dose of Zilebesiran). (Patient not taking: Reported on 11/26/2021)     escitalopram (LEXAPRO) 10 MG tablet Take 10 mg by mouth daily.     ezetimibe (ZETIA) 10 MG tablet Take 10 mg by mouth daily.     Investigational - Study Medication Inject 3 mg into the skin every 6 (six) months. [place] Study [place], [place], (IDS 2501) -eIRB [id number] injection (Patient not taking: Reported on 12/23/2021)     levothyroxine (SYNTHROID) 25 MCG tablet Take 25 mcg by mouth daily before breakfast.     No current facility-administered medications for this visit.     Allergies:  Allergies  Allergen  Reactions   Latex     Fever blisters   Monascus Purpureus Went Yeast Other (See Comments)    Red Yeast Rice - myalgia    Simvastatin Other (See Comments)    Myalgias   Sulfa Antibiotics     Unknown reaction     Physical Exam: Blood pressure 139/77, pulse 79, temperature 97.8 F (36.6 C), temperature source Temporal, resp. rate 16, height '5\' 7"'$  (1.702 m), weight 170 lb 14.4 oz (77.5 kg), last menstrual period 11/06/2002, SpO2 98 %.  ECOG: 1    General appearance: Comfortable appearing without any discomfort Head: Normocephalic without any trauma Oropharynx: Mucous membranes are moist and pink without any thrush or ulcers. Eyes: Pupils are equal and round reactive to light. Lymph nodes: No cervical, supraclavicular, inguinal or axillary lymphadenopathy.   Heart:regular rate and rhythm.  S1 and S2 without leg edema. Lung: Clear without any rhonchi or wheezes.  No dullness to percussion. Abdomin: Soft, nontender, nondistended with good bowel sounds.  No hepatosplenomegaly. Musculoskeletal: No joint deformity or effusion.  Full range of motion noted. Neurological: No deficits noted on motor, sensory and deep tendon reflex exam. Skin: No petechial rash or dryness.  Appeared moist.       Lab Results: Lab Results  Component Value Date   WBC 5.8 12/23/2021   HGB 13.3 12/23/2021   HCT 40.2 12/23/2021   MCV 95.0 12/23/2021   PLT 241 12/23/2021     Chemistry      Component Value Date/Time   NA 141 12/23/2021 1302   K 3.8 12/23/2021  1302   CL 106 12/23/2021 1302   CO2 30 12/23/2021 1302   BUN 16 12/23/2021 1302   CREATININE 1.15 (H) 12/23/2021 1302      Component Value Date/Time   CALCIUM 9.2 12/23/2021 1302   ALKPHOS 77 12/23/2021 1302   AST 21 12/23/2021 1302   ALT 20 12/23/2021 1302   BILITOT 0.4 12/23/2021 1302        Impression and Plan:   66 year old with:   1. T3a clear-cell renal cell carcinoma diagnosed in September 2023.  She was found to have  nuclear grade 3 with tumor invading into the renal veins segmental branch.     She is currently receiving adjuvant Pembrolizumab and completed the first cycle of therapy.  Risks and benefits of continuing this treatment long-term were reviewed.  Potential complications that include nausea, fatigue and autoimmune considerations were reiterated.  Plan is to complete 1 year of therapy or equivalent of 17 cycles.  She is agreeable to proceed at this time.   2.  IV access: None issues reported with peripheral veins at this time.   3.  Autoimmune considerations: I recommended continued monitoring including her thyroid function.  Other complications such as pneumonitis, colitis and hypophysitis were reiterated.   4.  Follow-up: In 3 weeks for the next cycle of therapy.   30  minutes were spent on this encounter.  The time was dedicated to reviewing laboratory data, disease status update and outlining future plan of care discussion.   Zola Button, MD 12/5/20238:27 AM

## 2022-02-03 ENCOUNTER — Inpatient Hospital Stay: Payer: Medicare Other

## 2022-02-03 ENCOUNTER — Other Ambulatory Visit: Payer: Self-pay

## 2022-02-03 VITALS — BP 145/69 | HR 67 | Temp 98.2°F | Resp 18

## 2022-02-03 DIAGNOSIS — Z5112 Encounter for antineoplastic immunotherapy: Secondary | ICD-10-CM | POA: Diagnosis not present

## 2022-02-03 DIAGNOSIS — C649 Malignant neoplasm of unspecified kidney, except renal pelvis: Secondary | ICD-10-CM | POA: Diagnosis not present

## 2022-02-03 DIAGNOSIS — Z79899 Other long term (current) drug therapy: Secondary | ICD-10-CM | POA: Diagnosis not present

## 2022-02-03 LAB — CBC WITH DIFFERENTIAL (CANCER CENTER ONLY)
Abs Immature Granulocytes: 0.01 10*3/uL (ref 0.00–0.07)
Basophils Absolute: 0 10*3/uL (ref 0.0–0.1)
Basophils Relative: 1 %
Eosinophils Absolute: 0.1 10*3/uL (ref 0.0–0.5)
Eosinophils Relative: 2 %
HCT: 39.5 % (ref 36.0–46.0)
Hemoglobin: 13.3 g/dL (ref 12.0–15.0)
Immature Granulocytes: 0 %
Lymphocytes Relative: 27 %
Lymphs Abs: 1.7 10*3/uL (ref 0.7–4.0)
MCH: 31.7 pg (ref 26.0–34.0)
MCHC: 33.7 g/dL (ref 30.0–36.0)
MCV: 94 fL (ref 80.0–100.0)
Monocytes Absolute: 0.5 10*3/uL (ref 0.1–1.0)
Monocytes Relative: 8 %
Neutro Abs: 3.8 10*3/uL (ref 1.7–7.7)
Neutrophils Relative %: 62 %
Platelet Count: 212 10*3/uL (ref 150–400)
RBC: 4.2 MIL/uL (ref 3.87–5.11)
RDW: 12.7 % (ref 11.5–15.5)
WBC Count: 6.1 10*3/uL (ref 4.0–10.5)
nRBC: 0 % (ref 0.0–0.2)

## 2022-02-03 LAB — CMP (CANCER CENTER ONLY)
ALT: 16 U/L (ref 0–44)
AST: 20 U/L (ref 15–41)
Albumin: 4 g/dL (ref 3.5–5.0)
Alkaline Phosphatase: 78 U/L (ref 38–126)
Anion gap: 6 (ref 5–15)
BUN: 19 mg/dL (ref 8–23)
CO2: 27 mmol/L (ref 22–32)
Calcium: 9.2 mg/dL (ref 8.9–10.3)
Chloride: 106 mmol/L (ref 98–111)
Creatinine: 1.06 mg/dL — ABNORMAL HIGH (ref 0.44–1.00)
GFR, Estimated: 58 mL/min — ABNORMAL LOW (ref >60)
Glucose, Bld: 87 mg/dL (ref 70–99)
Potassium: 4.1 mmol/L (ref 3.5–5.1)
Sodium: 139 mmol/L (ref 135–145)
Total Bilirubin: 0.5 mg/dL (ref 0.3–1.2)
Total Protein: 6.9 g/dL (ref 6.5–8.1)

## 2022-02-03 LAB — TSH: TSH: 1.802 u[IU]/mL (ref 0.350–4.500)

## 2022-02-03 MED ORDER — SODIUM CHLORIDE 0.9 % IV SOLN: Freq: Once | INTRAVENOUS | Status: AC

## 2022-02-03 MED ORDER — SODIUM CHLORIDE 0.9 % IV SOLN
200.0000 mg | Freq: Once | INTRAVENOUS | Status: AC
  Administered 2022-02-03: 200 mg via INTRAVENOUS
  Filled 2022-02-03: qty 8

## 2022-02-03 NOTE — Patient Instructions (Signed)
[facility]ICAL [facility]COLOG[facility]  Discharge Instructions: Thank you for choosing [facility] to provide your oncology and hematology care.   If you have a lab appointment with the Cancer Center, please go directly to the Cancer Center and check in at the registration area.   Wear comfortable clothing and clothing appropriate for easy access to any Portacath or PICC line.   We strive to give you quality time with your provider. You may need to reschedule your appointment if you arrive late (15 or more minutes).  Arriving late affects you and other patients whose appointments are after yours.  Also, if you miss three or more appointments without notifying the office, you may be dismissed from the clinic at the provider's discretion.      For prescription refill requests, have your pharmacy contact our office and allow 72 hours for refills to be completed.    Today you received the following chemotherapy and/or immunotherapy agents keytruda      To help prevent nausea and vomiting after your treatment, we encourage you to take your nausea medication as directed.  BELOW ARE SYMPTOMS THAT SHOULD BE REPORTED IMMEDIATELY: *FEVER GREATER THAN 100.4 F (38 C) OR HIGHER *CHILLS OR SWEATING *NAUSEA AND VOMITING THAT IS NOT CONTROLLED WITH YOUR NAUSEA MEDICATION *UNUSUAL SHORTNESS OF BREATH *UNUSUAL BRUISING OR BLEEDING *URINARY PROBLEMS (pain or burning when urinating, or frequent urination) *BOWEL PROBLEMS (unusual diarrhea, constipation, pain near the anus) TENDERNESS IN MOUTH AND THROAT WITH OR WITHOUT PRESENCE OF ULCERS (sore throat, sores in mouth, or a toothache) UNUSUAL RASH, SWELLING OR PAIN  UNUSUAL VAGINAL DISCHARGE OR ITCHING   Items with * indicate a potential emergency and should be followed up as soon as possible or go to the Emergency Department if any problems should occur.  Please show the CHEMOTHERAPY ALERT CARD or IMMUNOTHERAPY ALERT CARD at check-in to  the Emergency Department and triage nurse.  Should you have questions after your visit or need to cancel or reschedule your appointment, please contact [facility]  Dept: [phone]  and follow the prompts.  Office hours are 8:00 a.m. to 4:30 p.m. Monday - Friday. Please note that voicemails left after 4:00 p.m. may not be returned until the following business day.  We are closed weekends and major holidays. You have access to a nurse at all times for urgent questions. Please call the main number to the clinic Dept: [phone] and follow the prompts.   For any non-urgent questions, you may also contact your provider using MyChart. We now offer e-Visits for anyone 18 and older to request care online for non-urgent symptoms. For details visit [email or web address].   Also download the MyChart app! Go to the app store, search "MyChart", open the app, select [facility], and log in with your MyChart username and password.  Masks are optional in the cancer centers. If you would like for your care team to wear a mask while they are taking care of you, please let them know. You may have one support person who is at least 66 years old accompany you for your appointments. 

## 2022-02-04 LAB — T4: T4, Total: 7.2 ug/dL (ref 4.5–12.0)

## 2022-02-06 ENCOUNTER — Ambulatory Visit: Payer: Self-pay | Admitting: Genetic Counselor

## 2022-02-06 ENCOUNTER — Telehealth: Payer: Self-pay | Admitting: Genetic Counselor

## 2022-02-06 ENCOUNTER — Telehealth: Payer: Self-pay | Admitting: Oncology

## 2022-02-06 ENCOUNTER — Other Ambulatory Visit: Payer: Self-pay | Admitting: Oncology

## 2022-02-06 ENCOUNTER — Encounter: Payer: Self-pay | Admitting: Genetic Counselor

## 2022-02-06 DIAGNOSIS — Z1379 Encounter for other screening for genetic and chromosomal anomalies: Secondary | ICD-10-CM

## 2022-02-06 DIAGNOSIS — Z803 Family history of malignant neoplasm of breast: Secondary | ICD-10-CM

## 2022-02-06 DIAGNOSIS — C649 Malignant neoplasm of unspecified kidney, except renal pelvis: Secondary | ICD-10-CM

## 2022-02-06 DIAGNOSIS — Z8481 Family history of carrier of genetic disease: Secondary | ICD-10-CM

## 2022-02-06 DIAGNOSIS — Z8051 Family history of malignant neoplasm of kidney: Secondary | ICD-10-CM

## 2022-02-06 NOTE — Telephone Encounter (Signed)
Called patient regarding January appointments, patient is notified.  

## 2022-02-06 NOTE — Progress Notes (Signed)
HPI:   Cassidy Terry was previously seen in the [facility] due to a family history of known ATM gene mutation in her sister. Please refer to our prior cancer genetics clinic note for more information regarding our discussion, assessment and recommendations, at the time. [name]'s recent genetic test results were disclosed to her, as were recommendations warranted by these results. These results and recommendations are discussed in more detail below.  CANCER HISTORY:    In 2023, at the age of 66, Cassidy Terry was diagnosed with clear cell renal cell carcinoma.  She also has a history of basal cell carcinoma removed from her chest at the age of 82, and a lipoma of her buttock removed at age 35.   Oncology History  Cancer of kidney ([place])  11/26/2021 Initial Diagnosis   Cancer of kidney ([place])   11/26/2021 Cancer Staging   Staging form: Kidney, AJCC 8th Edition - Clinical: Stage III (cT3a, cN0, cM0) - Signed by Wyatt Portela, MD on 11/26/2021   12/23/2021 -  Chemotherapy   Patient is on Treatment Plan : BLADDER Pembrolizumab (200) q21d     02/04/2022 Genetic Testing   Negative genetics for [facility] CancerNext-Expanded +RNA Panel.  ATM mutation previously detected in sister was not detected in her sample. Report date is 02/04/2022.   The CancerNext-Expanded gene panel offered by [facility] and includes sequencing, rearrangement, and RNA analysis for the following 77 genes: AIP, ALK, APC, ATM, AXIN2, BAP1, BARD1, BLM, BMPR1A, BRCA1, BRCA2, BRIP1, CDC73, CDH1, CDK4, CDKN1B, CDKN2A, CHEK2, CTNNA1, DICER1, FANCC, FH, FLCN, GALNT12, KIF1B, LZTR1, MAX, MEN1, MET, MLH1, MSH2, MSH3, MSH6, MUTYH, NBN, NF1, NF2, NTHL1, PALB2, PHOX2B, PMS2, POT1, PRKAR1A, PTCH1, PTEN, RAD51C, RAD51D, RB1, RECQL, RET, SDHA, SDHAF2, SDHB, SDHC, SDHD, SMAD4, SMARCA4, SMARCB1, SMARCE1, STK11, SUFU, TMEM127, TP53, TSC1, TSC2, VHL and XRCC2 (sequencing and deletion/duplication); EGFR, EGLN1, HOXB13, KIT, MITF,  PDGFRA, POLD1, and POLE (sequencing only); EPCAM and GREM1 (deletion/duplication only).      FAMILY HISTORY:  We obtained a detailed, 4-generation family history.  Significant diagnoses are listed below: Family History  Problem Relation Age of Onset   Vaginal cancer Mother        dx after 66   Hypertension Father    Breast cancer Sister 10       ATM gene mutation   Breast cancer Maternal Grandmother        dx before 63?   Kidney cancer Maternal Grandmother        dx before 28?   Cancer Paternal Grandmother        unknown type; dx after 23      Cassidy Terry's sister, Cassidy Terry, had a pathogenic variant detected in ATM called  [id number].  No other deleterious variants were detected in her testing through [facility].  Report date is 12/16/2021.      Cassidy Terry is unaware of other previous family history of genetic testing for hereditary cancer risks. There is no reported Ashkenazi Jewish ancestry. There is no known consanguinity.  GENETIC TEST RESULTS:  The [facility] CancerNext-Expanded +RNA Panel found no pathogenic mutations.   The CancerNext-Expanded gene panel offered by [facility] and includes sequencing, rearrangement, and RNA analysis for the following 77 genes: AIP, ALK, APC, ATM, AXIN2, BAP1, BARD1, BLM, BMPR1A, BRCA1, BRCA2, BRIP1, CDC73, CDH1, CDK4, CDKN1B, CDKN2A, CHEK2, CTNNA1, DICER1, FANCC, FH, FLCN, GALNT12, KIF1B, LZTR1, MAX, MEN1, MET, MLH1, MSH2, MSH3, MSH6, MUTYH, NBN, NF1, NF2, NTHL1, PALB2, PHOX2B, PMS2, POT1,  PRKAR1A, PTCH1, PTEN, RAD51C, RAD51D, RB1, RECQL, RET, SDHA, SDHAF2, SDHB, SDHC, SDHD, SMAD4, SMARCA4, SMARCB1, SMARCE1, STK11, SUFU, TMEM127, TP53, TSC1, TSC2, VHL and XRCC2 (sequencing and deletion/duplication); EGFR, EGLN1, HOXB13, KIT, MITF, PDGFRA, POLD1, and POLE (sequencing only); EPCAM and GREM1 (deletion/duplication only).  .   The test report has been scanned into EPIC and is located under the Molecular Pathology  section of the Results Review tab.  A portion of the result report is included below for reference. Genetic testing reported out on February 04, 2022.     The ATM  [id number] mutation, which was previously detected in her sister, was not detected in Cassidy Terry's sample.  We call this result a true negative result because a cancer-causing mutation was identified in Ms. Newby's family, and she did not inherit it.  Given this negative result, Cassidy Terry's chances of developing ATM-related cancers (breast, ovarian, pancreatic) are closer to that of the general population.     Even though a pathogenic variant was not identified, possible explanations for her personal history of kidney cancer include:  There may be no hereditary risk for cancer related to the kidney cancer in the family. It may be sporadic/familial or due to other genetic and environmental factors. There may be a gene mutation in one of these genes that current testing methods cannot detect but that chance is small. There could be another gene that has not yet been discovered, or that we have not yet tested, that is responsible for the cancer diagnoses in the family.    Therefore, it is important to remain in touch with cancer genetics in the future so that we can continue to offer Cassidy Terry the most up to date genetic testing.     ADDITIONAL GENETIC TESTING:  We discussed with Cassidy Terry that her genetic testing was fairly extensive.  If there are additional relevant genes identified to increase cancer risk that can be analyzed in the future, we would be happy to discuss and coordinate this testing at that time.     CANCER SCREENING RECOMMENDATIONS:  Cassidy Terry's test result is considered negative (normal).  This means that we have not identified a hereditary cause for her personal history of kidney cancer at this time.  We have not detected the familial ATM mutation in her sample; thus, ATM-related high risk cancer screening is  not indicated for Cassidy Terry.   An individual's cancer risk and medical management are not determined by genetic test results alone. Overall cancer risk assessment incorporates additional factors, including personal medical history, family history, and any available genetic information that may result in a personalized plan for cancer prevention and surveillance. Therefore, it is recommended she continue to follow the cancer management and screening guidelines provided by her oncology and primary healthcare provider.  RECOMMENDATIONS FOR FAMILY MEMBERS:   Since she did not inherit a identifiable mutation in a cancer predisposition gene included on this panel, her children could not have inherited a known mutation from her in one of these genes. Individuals in this family might be at some increased risk of developing cancer, over the general population risk, due to the family history of cancer.  Individuals in the family should notify their providers of the family history of cancer.  Based on the family history of the known ATM gene mutation in her sister, we recommend her maternal half siblings and other relatives have genetic counseling and testing. Ms. Claycomb will let us know if we can   FOLLOW-UP:  Lastly, we discussed with Ms. Mccaughey that cancer genetics is a rapidly advancing field and it is possible that new genetic tests will be appropriate for her and/or her family members in the future. We encouraged her to remain in contact with cancer genetics on an annual basis so we can update her personal and family histories and let her know of advances in cancer genetics that may benefit this family.   Our contact number was provided. Ms. Meader's questions were answered to her satisfaction, and she knows she is welcome to call us at anytime with additional questions or concerns.   Longino Trefz M. Joette Catching, [place], [facility] Genetic Counselor [email or web address] (P) [phone]

## 2022-02-06 NOTE — Telephone Encounter (Signed)
Revealed negative genetics.  ATM variant previously detected in sister was not detected in her sample.

## 2022-02-10 DIAGNOSIS — D49511 Neoplasm of unspecified behavior of right kidney: Secondary | ICD-10-CM | POA: Diagnosis not present

## 2022-02-11 ENCOUNTER — Other Ambulatory Visit: Payer: Medicare Other

## 2022-02-11 ENCOUNTER — Ambulatory Visit: Payer: Medicare Other | Admitting: Oncology

## 2022-02-17 ENCOUNTER — Encounter: Payer: Self-pay | Admitting: Oncology

## 2022-02-17 DIAGNOSIS — N200 Calculus of kidney: Secondary | ICD-10-CM | POA: Diagnosis not present

## 2022-02-17 DIAGNOSIS — D49511 Neoplasm of unspecified behavior of right kidney: Secondary | ICD-10-CM | POA: Diagnosis not present

## 2022-02-17 DIAGNOSIS — J984 Other disorders of lung: Secondary | ICD-10-CM | POA: Diagnosis not present

## 2022-02-24 ENCOUNTER — Inpatient Hospital Stay: Payer: Medicare Other

## 2022-02-24 ENCOUNTER — Inpatient Hospital Stay: Payer: Medicare Other | Admitting: Oncology

## 2022-02-24 ENCOUNTER — Inpatient Hospital Stay: Payer: Medicare Other | Attending: Oncology

## 2022-02-24 VITALS — BP 147/83 | HR 61 | Temp 98.0°F | Resp 18 | Ht 67.0 in | Wt 172.5 lb

## 2022-02-24 VITALS — BP 125/62 | HR 76 | Temp 98.1°F | Resp 16

## 2022-02-24 DIAGNOSIS — C649 Malignant neoplasm of unspecified kidney, except renal pelvis: Secondary | ICD-10-CM

## 2022-02-24 DIAGNOSIS — N200 Calculus of kidney: Secondary | ICD-10-CM | POA: Diagnosis not present

## 2022-02-24 DIAGNOSIS — R3915 Urgency of urination: Secondary | ICD-10-CM | POA: Diagnosis not present

## 2022-02-24 DIAGNOSIS — Z5112 Encounter for antineoplastic immunotherapy: Secondary | ICD-10-CM | POA: Diagnosis not present

## 2022-02-24 DIAGNOSIS — C7989 Secondary malignant neoplasm of other specified sites: Secondary | ICD-10-CM | POA: Diagnosis not present

## 2022-02-24 DIAGNOSIS — C641 Malignant neoplasm of right kidney, except renal pelvis: Secondary | ICD-10-CM | POA: Insufficient documentation

## 2022-02-24 DIAGNOSIS — D49511 Neoplasm of unspecified behavior of right kidney: Secondary | ICD-10-CM | POA: Diagnosis not present

## 2022-02-24 DIAGNOSIS — Z79899 Other long term (current) drug therapy: Secondary | ICD-10-CM | POA: Insufficient documentation

## 2022-02-24 LAB — CMP (CANCER CENTER ONLY)
ALT: 15 U/L (ref 0–44)
AST: 17 U/L (ref 15–41)
Albumin: 3.7 g/dL (ref 3.5–5.0)
Alkaline Phosphatase: 69 U/L (ref 38–126)
Anion gap: 8 (ref 5–15)
BUN: 18 mg/dL (ref 8–23)
CO2: 26 mmol/L (ref 22–32)
Calcium: 9.4 mg/dL (ref 8.9–10.3)
Chloride: 105 mmol/L (ref 98–111)
Creatinine: 1.02 mg/dL — ABNORMAL HIGH (ref 0.44–1.00)
GFR, Estimated: >60 mL/min (ref >60)
Glucose, Bld: 75 mg/dL (ref 70–99)
Potassium: 4 mmol/L (ref 3.5–5.1)
Sodium: 139 mmol/L (ref 135–145)
Total Bilirubin: 0.4 mg/dL (ref 0.3–1.2)
Total Protein: 6.6 g/dL (ref 6.5–8.1)

## 2022-02-24 LAB — CBC WITH DIFFERENTIAL (CANCER CENTER ONLY)
Abs Immature Granulocytes: 0.09 10*3/uL — ABNORMAL HIGH (ref 0.00–0.07)
Basophils Absolute: 0.1 10*3/uL (ref 0.0–0.1)
Basophils Relative: 1 %
Eosinophils Absolute: 0.2 10*3/uL (ref 0.0–0.5)
Eosinophils Relative: 3 %
HCT: 42.5 % (ref 36.0–46.0)
Hemoglobin: 14 g/dL (ref 12.0–15.0)
Immature Granulocytes: 1 %
Lymphocytes Relative: 32 %
Lymphs Abs: 2.1 10*3/uL (ref 0.7–4.0)
MCH: 31.8 pg (ref 26.0–34.0)
MCHC: 32.9 g/dL (ref 30.0–36.0)
MCV: 96.6 fL (ref 80.0–100.0)
Monocytes Absolute: 0.6 10*3/uL (ref 0.1–1.0)
Monocytes Relative: 9 %
Neutro Abs: 3.5 10*3/uL (ref 1.7–7.7)
Neutrophils Relative %: 54 %
Platelet Count: 188 10*3/uL (ref 150–400)
RBC: 4.4 MIL/uL (ref 3.87–5.11)
RDW: 13 % (ref 11.5–15.5)
WBC Count: 6.6 10*3/uL (ref 4.0–10.5)
nRBC: 0 % (ref 0.0–0.2)

## 2022-02-24 LAB — TSH: TSH: 7.255 u[IU]/mL — ABNORMAL HIGH (ref 0.350–4.500)

## 2022-02-24 MED ORDER — SODIUM CHLORIDE 0.9 % IV SOLN
200.0000 mg | Freq: Once | INTRAVENOUS | Status: AC
  Administered 2022-02-24: 200 mg via INTRAVENOUS
  Filled 2022-02-24: qty 200

## 2022-02-24 MED ORDER — SODIUM CHLORIDE 0.9 % IV SOLN: Freq: Once | INTRAVENOUS | Status: AC

## 2022-02-24 NOTE — Progress Notes (Signed)
Hematology and Oncology Follow Up Visit  LENNETTE Cassidy Terry [id number] 28-Nov-1955 67 y.o. 02/24/2022 8:05 AM [name], MDShadad, Mathis Dad, MD   Principle Diagnosis: 49 year old woman with kidney cancer diagnosed in September 2023.  She was found to have T3a clear-cell renal cell carcinoma without any evidence of metastatic disease.  Prior Therapy: She is  S/P robotic assisted laparoscopic right radical nephrectomy on November 03, 2021. The final pathology showed clear-cell renal cell carcinoma nuclear grade 2 measuring 3.5 cm with tumor extending into the renal vein indicating T3a disease.   Current therapy: Pembrolizumab 200 mg every 3 weeks started on December 23, 2021.  She is here for cycle 4 out of planned 17 treatments.  Interim History: Ms. Rankin presents today for repeat evaluation.  Since last visit, she reports no major changes in her health.  She continues to tolerate Pembrolizumab without any complications.  She did report minor exacerbation of her psoriasis and uses topical creams to manage it.  He does not report any diffuse plaques or systemic complaints.  She denies any nausea, vomiting or abdominal pain.  She denies any generalized pruritus.    Medications: Updated on review. Current Outpatient Medications  Medication Sig Dispense Refill   Cetirizine HCl (ZYRTEC ALLERGY PO)      escitalopram (LEXAPRO) 10 MG tablet Take 10 mg by mouth daily.     ezetimibe (ZETIA) 10 MG tablet Take 10 mg by mouth daily.     Investigational - Study Medication Inject 3 mg into the skin every 6 (six) months. [place] Study [place], [place], (IDS 2501) -eIRB [id number] injection (Patient not taking: Reported on 12/23/2021)     levothyroxine (SYNTHROID) 25 MCG tablet Take 25 mcg by mouth daily before breakfast.     No current facility-administered medications for this visit.     Allergies:  Allergies  Allergen Reactions   Latex     Fever blisters   [place]ascus Purpureus Went Yeast Other (See  Comments)    Red Yeast Rice - myalgia    Simvastatin Other (See Comments)    Myalgias   Sulfa Antibiotics     Unknown reaction     Physical Exam:  Blood pressure (!) 147/83, pulse 61, temperature 98 F (36.7 C), temperature source Temporal, resp. rate 18, height '5\' 7"'$  (1.702 m), weight 172 lb 8 oz (78.2 kg), last menstrual period 11/06/2002, SpO2 99 %.  ECOG: 1    General appearance: Alert, awake without any distress. Head: Atraumatic without abnormalities Oropharynx: Without any thrush or ulcers. Eyes: No scleral icterus. Lymph nodes: No lymphadenopathy noted in the cervical, supraclavicular, or axillary nodes Heart:regular rate and rhythm, without any murmurs or gallops.   Lung: Clear to auscultation without any rhonchi, wheezes or dullness to percussion. Abdomin: Soft, nontender without any shifting dullness or ascites. Musculoskeletal: No clubbing or cyanosis. Neurological: No motor or sensory deficits. Skin: No rashes or lesions.        Lab Results: Lab Results  Component Value Date   WBC 6.1 02/03/2022   HGB 13.3 02/03/2022   HCT 39.5 02/03/2022   MCV 94.0 02/03/2022   PLT 212 02/03/2022     Chemistry      Component Value Date/Time   NA 139 02/03/2022 1247   K 4.1 02/03/2022 1247   CL 106 02/03/2022 1247   CO2 27 02/03/2022 1247   BUN 19 02/03/2022 1247   CREATININE 1.06 (H) 02/03/2022 1247      Component Value Date/Time   CALCIUM 9.2 02/03/2022 1247  ALKPHOS 78 02/03/2022 1247   AST 20 02/03/2022 1247   ALT 16 02/03/2022 1247   BILITOT 0.5 02/03/2022 1247     IMPRESSION: 1. Prior right nephrectomy with linear band of fluid/soft tissue in the surgical bed measuring 2.4 x 1.3 cm without focal nodular enhancement, favored to reflect postsurgical change. Attention on follow-up imaging suggested. 2. No evidence of metastatic disease within the chest, abdomen, or pelvis. 3. Nonobstructive 2 mm left lower pole renal stone. 4. Aortic  Atherosclerosis (ICD10-I70.0).    Impression and Plan:   67 year old with:   1.  Kidney cancer diagnosed in September 2023.  She was found to have T3a clear-cell renal cell carcinoma,nuclear grade 3 with tumor invading into the renal veins segmental branch.  No evidence of metastatic disease at that time.   She remains on Pembrolizumab without any major complications with plan to complete 17 cycles of therapy or close to 1 year.  Risks and benefits of continuing this treatment long-term were discussed.  Complications that include nausea, fatigue and autoimmune considerations were reiterated.  She is agreeable to proceed for the time being.  CT scan obtained under the care of [facility] showed no evidence of metastatic disease at this time.  She will continue to have surveillance imaging periodically during adjuvant treatment and after.   2.  IV access: Peripheral veins continue to be in use at this time.   3.  Autoimmune considerations: Complications including pneumonitis, colitis, hypophysitis and thyroid disease were reiterated.  She is experiencing mild dermatological toxicity related to psoriasis reactivation but manageable with topical creams.  No need for medication withdrawal for the time being.   4.  Follow-up: He will return in 3 weeks for a follow-up.   30  minutes were dedicated to this visit.  The time was spent on updating disease status, treatment choices and outlining future plan of care discussion.   Zola Button, MD 1/16/20248:05 AM

## 2022-02-24 NOTE — Patient Instructions (Signed)
[facility]  Discharge Instructions: Thank you for choosing [facility] to provide your oncology and hematology care.   If you have a lab appointment with the [facility], please go directly to the [facility] and check in at the registration area.   Wear comfortable clothing and clothing appropriate for easy access to any Portacath or PICC line.   We strive to give you quality time with your provider. You may need to reschedule your appointment if you arrive late (15 or more minutes).  Arriving late affects you and other patients whose appointments are after yours.  Also, if you miss three or more appointments without notifying the office, you may be dismissed from the clinic at the provider's discretion.      For prescription refill requests, have your pharmacy contact our office and allow 72 hours for refills to be completed.    Today you received the following chemotherapy and/or immunotherapy agents: Keytruda      To help prevent nausea and vomiting after your treatment, we encourage you to take your nausea medication as directed.  BELOW ARE SYMPTOMS THAT SHOULD BE REPORTED IMMEDIATELY: *FEVER GREATER THAN 100.4 F (38 C) OR HIGHER *CHILLS OR SWEATING *NAUSEA AND VOMITING THAT IS NOT CONTROLLED WITH YOUR NAUSEA MEDICATION *UNUSUAL SHORTNESS OF BREATH *UNUSUAL BRUISING OR BLEEDING *URINARY PROBLEMS (pain or burning when urinating, or frequent urination) *BOWEL PROBLEMS (unusual diarrhea, constipation, pain near the anus) TENDERNESS IN MOUTH AND THROAT WITH OR WITHOUT PRESENCE OF ULCERS (sore throat, sores in mouth, or a toothache) UNUSUAL RASH, SWELLING OR PAIN  UNUSUAL VAGINAL DISCHARGE OR ITCHING   Items with * indicate a potential emergency and should be followed up as soon as possible or go to the Emergency Department if any problems should occur.  Please show the CHEMOTHERAPY ALERT CARD or IMMUNOTHERAPY ALERT CARD at check-in to  the Emergency Department and triage nurse.  Should you have questions after your visit or need to cancel or reschedule your appointment, please contact [facility]  Dept: [phone]  and follow the prompts.  Office hours are 8:00 a.m. to 4:30 p.m. Monday - Friday. Please note that voicemails left after 4:00 p.m. may not be returned until the following business day.  We are closed weekends and major holidays. You have access to a nurse at all times for urgent questions. Please call the main number to the clinic Dept: [phone] and follow the prompts.   For any non-urgent questions, you may also contact your provider using MyChart. We now offer e-Visits for anyone 44 and older to request care online for non-urgent symptoms. For details visit [email or web address].   Also download the MyChart app! Go to the app store, search "MyChart", open the app, select [facility], and log in with your MyChart username and password.

## 2022-02-25 ENCOUNTER — Other Ambulatory Visit: Payer: Self-pay

## 2022-03-02 LAB — MULTIPLE MYELOMA PANEL, SERUM
Albumin SerPl Elph-Mcnc: 3.6 g/dL (ref 2.9–4.4)
Albumin/Glob SerPl: 1.4 (ref 0.7–1.7)
Alpha 1: 0.2 g/dL (ref 0.0–0.4)
Alpha2 Glob SerPl Elph-Mcnc: 0.6 g/dL (ref 0.4–1.0)
B-Globulin SerPl Elph-Mcnc: 1.2 g/dL (ref 0.7–1.3)
Gamma Glob SerPl Elph-Mcnc: 0.7 g/dL (ref 0.4–1.8)
Globulin, Total: 2.7 g/dL (ref 2.2–3.9)
IgA: 442 mg/dL — ABNORMAL HIGH (ref 87–352)
IgG (Immunoglobin G), Serum: 739 mg/dL (ref 586–1602)
IgM (Immunoglobulin M), Srm: 85 mg/dL (ref 26–217)
Total Protein ELP: 6.3 g/dL (ref 6.0–8.5)

## 2022-03-14 NOTE — Progress Notes (Unsigned)
[facility] OFFICE PROGRESS NOTE  Cassidy Jordan, MD [address] [place] [address]  DIAGNOSIS: kidney cancer diagnosed in September 2023.  She was found to have T3a clear-cell renal cell carcinoma without any evidence of metastatic disease.   PRIOR THERAPY: he is  S/P robotic assisted laparoscopic right radical nephrectomy on November 03, 2021. The final pathology showed clear-cell renal cell carcinoma nuclear grade 2 measuring 3.5 cm with tumor extending into the renal vein indicating T3a disease.   CURRENT THERAPY:  Pembrolizumab 200 mg every 3 weeks started on December 23, 2021.  She is here for cycle 5 out of planned 17 treatments.   INTERVAL HISTORY: Cassidy Terry 67 y.o. female returns to clinic today for follow-up visit.  The patient was last seen 3 weeks ago by Dr. Alen Blew.  The patient is currently undergoing immunotherapy with Keytruda IV every 3 weeks and she is tolerating this well without any concerning adverse side effects except it does exacerbate her psoriasis.  She uses topical creams to manage.  Since last being seen the patient denies any major changes in her health.  Denies any fever, chills, night sweats, or unexplained weight loss.  Denies any nausea, vomiting, diarrhea, or constipation.  Denies any changes with urination such as hematuria, dysuria, or malodorous urine.  Denies any abdominal pain or back pain.  Denies any chest pain, shortness of breath, cough, or hemoptysis.  She denies any rashes or skin change***itchings.  She is here today for evaluation repeat blood work before starting cycle #5.  MEDICAL HISTORY: Past Medical History:  Diagnosis Date   Arthritis    Depression    Hypertension    Hypothyroidism    Psoriasis    Sleep apnea    Mild without the use of a cpap    ALLERGIES:  is allergic to latex, monascus purpureus went yeast, simvastatin, and sulfa antibiotics.  MEDICATIONS:  Current Outpatient Medications   Medication Sig Dispense Refill   Cetirizine HCl (ZYRTEC ALLERGY PO)      escitalopram (LEXAPRO) 10 MG tablet Take 10 mg by mouth daily.     ezetimibe (ZETIA) 10 MG tablet Take 10 mg by mouth daily.     Investigational - Study Medication Inject 3 mg into the skin every 6 (six) months. [place] Study Bushnell, Harvey, (IDS 2501) -eIRB [id number] injection (Patient not taking: Reported on 12/23/2021)     levothyroxine (SYNTHROID) 25 MCG tablet Take 25 mcg by mouth daily before breakfast.     No current facility-administered medications for this visit.    SURGICAL HISTORY:  Past Surgical History:  Procedure Laterality Date   ABDOMINAL HYSTERECTOMY     TAH FOR HEMATOMETRIA   BELPHAROPTOSIS REPAIR     Bilateral   CARPAL TUNNEL RELEASE Left 02/24/2018   Procedure: LEFT CARPAL TUNNEL RELEASE;  Surgeon: Daryll Brod, MD;  Location: [facility];  Service: Orthopedics;  Laterality: Left;   CESAREAN SECTION     X2   CHOLECYSTECTOMY N/A 07/20/2021   Procedure: LAPAROSCOPIC CHOLECYSTECTOMY;  Surgeon: Greer Pickerel, MD;  Location: [facility];  Service: General;  Laterality: N/A;   DILATION AND CURETTAGE OF UTERUS     X2   ENDOMETRIAL ABLATION     ENDOMYORESECTION   FOOT SURGERY  10/2010   rt. foot, 2nd toe, 1 pin to stay in   [place] Left 02/13/2020   Procedure: EXCISION SUBCUTANEOUS BUTTOCK LIPOMA;  Surgeon: Rolm Bookbinder, MD;  Location: [facility];  Service: General;  Laterality: Left;  GEN AND LMA   ROBOT ASSISTED LAPAROSCOPIC NEPHRECTOMY Right 11/03/2021   Procedure: XI ROBOTIC ASSISTED LAPAROSCOPIC NEPHRECTOMY;  Surgeon: Janith Lima, MD;  [facility];  Service: Urology;  Laterality: Right;   TONSILLECTOMY     TUBAL LIGATION     LTL-FULGURATION/TRANSECTION    REVIEW OF SYSTEMS:   Review of Systems  Constitutional: Negative for appetite change, chills, fatigue, fever and unexpected weight change.  HENT:   Negative for mouth sores, nosebleeds, sore  throat and trouble swallowing.   Eyes: Negative for eye problems and icterus.  Respiratory: Negative for cough, hemoptysis, shortness of breath and wheezing.   Cardiovascular: Negative for chest pain and leg swelling.  Gastrointestinal: Negative for abdominal pain, constipation, diarrhea, nausea and vomiting.  Genitourinary: Negative for bladder incontinence, difficulty urinating, dysuria, frequency and hematuria.   Musculoskeletal: Negative for back pain, gait problem, neck pain and neck stiffness.  Skin: Negative for itching and rash.  Neurological: Negative for dizziness, extremity weakness, gait problem, headaches, light-headedness and seizures.  Hematological: Negative for adenopathy. Does not bruise/bleed easily.  Psychiatric/Behavioral: Negative for confusion, depression and sleep disturbance. The patient is not nervous/anxious.     PHYSICAL EXAMINATION:  Last menstrual period 11/06/2002.  ECOG PERFORMANCE STATUS: {CHL ONC ECOG [id number]  Physical Exam  Constitutional: Oriented to person, place, and time and well-developed, well-nourished, and in no distress. No distress.  HENT:  Head: Normocephalic and atraumatic.  Mouth/Throat: Oropharynx is clear and moist. No oropharyngeal exudate.  Eyes: Conjunctivae are normal. Right eye exhibits no discharge. Left eye exhibits no discharge. No scleral icterus.  Neck: Normal range of motion. Neck supple.  Cardiovascular: Normal rate, regular rhythm, normal heart sounds and intact distal pulses.   Pulmonary/Chest: Effort normal and breath sounds normal. No respiratory distress. No wheezes. No rales.  Abdominal: Soft. Bowel sounds are normal. Exhibits no distension and no mass. There is no tenderness.  Musculoskeletal: Normal range of motion. Exhibits no edema.  Lymphadenopathy:    No cervical adenopathy.  Neurological: Alert and oriented to person, place, and time. Exhibits normal muscle tone. Gait normal. Coordination normal.  Skin:  Skin is warm and dry. No rash noted. Not diaphoretic. No erythema. No pallor.  Psychiatric: Mood, memory and judgment normal.  Vitals reviewed.  LABORATORY DATA: Lab Results  Component Value Date   WBC 6.6 02/24/2022   HGB 14.0 02/24/2022   HCT 42.5 02/24/2022   MCV 96.6 02/24/2022   PLT 188 02/24/2022      Chemistry      Component Value Date/Time   NA 139 02/24/2022 0752   K 4.0 02/24/2022 0752   CL 105 02/24/2022 0752   CO2 26 02/24/2022 0752   BUN 18 02/24/2022 0752   CREATININE 1.02 (H) 02/24/2022 0752      Component Value Date/Time   CALCIUM 9.4 02/24/2022 0752   ALKPHOS 69 02/24/2022 0752   AST 17 02/24/2022 0752   ALT 15 02/24/2022 0752   BILITOT 0.4 02/24/2022 0752       RADIOGRAPHIC STUDIES:  No results found.   ASSESSMENT/PLAN:  This is a very pleasant 67 year old Caucasian female diagnosed with renal cell carcinoma.  She was diagnosed with T3a grade 3*** tumor with a tumor invading the renal veins segmental branch.  She was diagnosed in September 2023.  The patient is status post robotic assisted laparoscopic right radical nephrectomy on 11/03/2021 for which the final pathology showed clear-cell renal cell carcinoma nuclear grade *** 2 or  3??? ***With tumor extending in the renal veins indicating T3a disease.  The patient is currently undergoing immunotherapy with Keytruda 200 mg IV every 3 weeks.  She is status post 4 cycles.  The plan is complete 17 cycles of therapy or close to 1 year.  Thus far she is tolerating it well without any concerning adverse side effects except for exacerbation of her psoriasis.  The patient was seen with Dr. Julien Nordmann today.  Labs were reviewed.  Recommend that she proceed with cycle #5 today's schedule.  Will see her back for follow-up visit in 3 weeks for evaluation repeat blood work before undergoing cycle #6.  Continue to follow with [facility]?  Psoriasis  The patient was advised to call immediately if she has any  concerning symptoms in the interval. The patient voices understanding of current disease status and treatment options and is in agreement with the current care plan. All questions were answered. The patient knows to call the clinic with any problems, questions or concerns. We can certainly see the patient much sooner if necessary   No orders of the defined types were placed in this encounter.    I spent {CHL ONC TIME VISIT - FAOZH:[id number]} counseling the patient face to face. The total time spent in the appointment was {CHL ONC TIME VISIT - EXBMW:[id number]}.  Jsean Taussig L Jaber Dunlow, PA-C 03/14/22

## 2022-03-16 ENCOUNTER — Other Ambulatory Visit: Payer: Self-pay | Admitting: Oncology

## 2022-03-16 ENCOUNTER — Encounter (INDEPENDENT_AMBULATORY_CARE_PROVIDER_SITE_OTHER): Payer: Self-pay

## 2022-03-17 ENCOUNTER — Inpatient Hospital Stay: Payer: Medicare Other | Attending: Oncology

## 2022-03-17 ENCOUNTER — Encounter: Payer: Self-pay | Admitting: Internal Medicine

## 2022-03-17 ENCOUNTER — Inpatient Hospital Stay: Payer: Medicare Other

## 2022-03-17 ENCOUNTER — Inpatient Hospital Stay (HOSPITAL_BASED_OUTPATIENT_CLINIC_OR_DEPARTMENT_OTHER): Payer: Medicare Other | Admitting: Physician Assistant

## 2022-03-17 VITALS — BP 131/65 | HR 69 | Temp 97.7°F | Resp 16

## 2022-03-17 VITALS — BP 138/71 | HR 64 | Temp 98.2°F | Resp 15 | Ht 67.0 in | Wt 172.9 lb

## 2022-03-17 DIAGNOSIS — C649 Malignant neoplasm of unspecified kidney, except renal pelvis: Secondary | ICD-10-CM | POA: Diagnosis not present

## 2022-03-17 DIAGNOSIS — C641 Malignant neoplasm of right kidney, except renal pelvis: Secondary | ICD-10-CM | POA: Insufficient documentation

## 2022-03-17 DIAGNOSIS — C7989 Secondary malignant neoplasm of other specified sites: Secondary | ICD-10-CM | POA: Diagnosis not present

## 2022-03-17 DIAGNOSIS — L409 Psoriasis, unspecified: Secondary | ICD-10-CM | POA: Diagnosis not present

## 2022-03-17 DIAGNOSIS — Z5112 Encounter for antineoplastic immunotherapy: Secondary | ICD-10-CM | POA: Diagnosis not present

## 2022-03-17 DIAGNOSIS — Z79899 Other long term (current) drug therapy: Secondary | ICD-10-CM | POA: Insufficient documentation

## 2022-03-17 LAB — CBC WITH DIFFERENTIAL (CANCER CENTER ONLY)
Abs Immature Granulocytes: 0.02 10*3/uL (ref 0.00–0.07)
Basophils Absolute: 0 10*3/uL (ref 0.0–0.1)
Basophils Relative: 1 %
Eosinophils Absolute: 0.2 10*3/uL (ref 0.0–0.5)
Eosinophils Relative: 3 %
HCT: 40.6 % (ref 36.0–46.0)
Hemoglobin: 13.7 g/dL (ref 12.0–15.0)
Immature Granulocytes: 0 %
Lymphocytes Relative: 28 %
Lymphs Abs: 1.5 10*3/uL (ref 0.7–4.0)
MCH: 31.5 pg (ref 26.0–34.0)
MCHC: 33.7 g/dL (ref 30.0–36.0)
MCV: 93.3 fL (ref 80.0–100.0)
Monocytes Absolute: 0.5 10*3/uL (ref 0.1–1.0)
Monocytes Relative: 9 %
Neutro Abs: 3.1 10*3/uL (ref 1.7–7.7)
Neutrophils Relative %: 59 %
Platelet Count: 117 10*3/uL — ABNORMAL LOW (ref 150–400)
RBC: 4.35 MIL/uL (ref 3.87–5.11)
RDW: 13.2 % (ref 11.5–15.5)
WBC Count: 5.3 10*3/uL (ref 4.0–10.5)
nRBC: 0 % (ref 0.0–0.2)

## 2022-03-17 LAB — CMP (CANCER CENTER ONLY)
ALT: 12 U/L (ref 0–44)
AST: 19 U/L (ref 15–41)
Albumin: 3.7 g/dL (ref 3.5–5.0)
Alkaline Phosphatase: 73 U/L (ref 38–126)
Anion gap: 8 (ref 5–15)
BUN: 16 mg/dL (ref 8–23)
CO2: 25 mmol/L (ref 22–32)
Calcium: 9.1 mg/dL (ref 8.9–10.3)
Chloride: 106 mmol/L (ref 98–111)
Creatinine: 0.97 mg/dL (ref 0.44–1.00)
GFR, Estimated: >60 mL/min (ref >60)
Glucose, Bld: 84 mg/dL (ref 70–99)
Potassium: 3.6 mmol/L (ref 3.5–5.1)
Sodium: 139 mmol/L (ref 135–145)
Total Bilirubin: 0.7 mg/dL (ref 0.3–1.2)
Total Protein: 6.2 g/dL — ABNORMAL LOW (ref 6.5–8.1)

## 2022-03-17 LAB — TSH: TSH: 2.161 u[IU]/mL (ref 0.350–4.500)

## 2022-03-17 MED ORDER — SODIUM CHLORIDE 0.9 % IV SOLN
200.0000 mg | Freq: Once | INTRAVENOUS | Status: AC
  Administered 2022-03-17: 200 mg via INTRAVENOUS
  Filled 2022-03-17: qty 8

## 2022-03-17 MED ORDER — SODIUM CHLORIDE 0.9 % IV SOLN: Freq: Once | INTRAVENOUS | Status: AC

## 2022-03-17 NOTE — Progress Notes (Signed)
Pt returned my call so I gave her the contact info for [employer] to apply for possible financial assistance for [facility].  I also went over the [employer] and gave her the income requirement.  She would like to apply so she will bring Cassidy Terry her proof of income on 04/07/22.

## 2022-03-17 NOTE — Patient Instructions (Signed)
[facility]  Discharge Instructions: Thank you for choosing [facility] to provide your oncology and hematology care.   If you have a lab appointment with the Cancer Center, please go directly to the Cancer Center and check in at the registration area.   Wear comfortable clothing and clothing appropriate for easy access to any Portacath or PICC line.   We strive to give you quality time with your provider. You may need to reschedule your appointment if you arrive late (15 or more minutes).  Arriving late affects you and other patients whose appointments are after yours.  Also, if you miss three or more appointments without notifying the office, you may be dismissed from the clinic at the provider's discretion.      For prescription refill requests, have your pharmacy contact our office and allow 72 hours for refills to be completed.    Today you received the following chemotherapy and/or immunotherapy agents Keytruda      To help prevent nausea and vomiting after your treatment, we encourage you to take your nausea medication as directed.  BELOW ARE SYMPTOMS THAT SHOULD BE REPORTED IMMEDIATELY: *FEVER GREATER THAN 100.4 F (38 C) OR HIGHER *CHILLS OR SWEATING *NAUSEA AND VOMITING THAT IS NOT CONTROLLED WITH YOUR NAUSEA MEDICATION *UNUSUAL SHORTNESS OF BREATH *UNUSUAL BRUISING OR BLEEDING *URINARY PROBLEMS (pain or burning when urinating, or frequent urination) *BOWEL PROBLEMS (unusual diarrhea, constipation, pain near the anus) TENDERNESS IN MOUTH AND THROAT WITH OR WITHOUT PRESENCE OF ULCERS (sore throat, sores in mouth, or a toothache) UNUSUAL RASH, SWELLING OR PAIN  UNUSUAL VAGINAL DISCHARGE OR ITCHING   Items with * indicate a potential emergency and should be followed up as soon as possible or go to the Emergency Department if any problems should occur.  Please show the CHEMOTHERAPY ALERT CARD or IMMUNOTHERAPY ALERT CARD at  check-in to the Emergency Department and triage nurse.  Should you have questions after your visit or need to cancel or reschedule your appointment, please contact [facility]  Dept: [phone]  and follow the prompts.  Office hours are 8:00 a.m. to 4:30 p.m. Monday - Friday. Please note that voicemails left after 4:00 p.m. may not be returned until the following business day.  We are closed weekends and major holidays. You have access to a nurse at all times for urgent questions. Please call the main number to the clinic Dept: [phone] and follow the prompts.   For any non-urgent questions, you may also contact your provider using MyChart. We now offer e-Visits for anyone 18 and older to request care online for non-urgent symptoms. For details visit [email or web address].   Also download the MyChart app! Go to the app store, search "MyChart", open the app, select [facility], and log in with your MyChart username and password.   

## 2022-03-17 NOTE — Progress Notes (Signed)
Called pt in behalf of Marguarite Arbour to discuss the [employer] and to give her [employer] contact information regarding her application for financial assistance for [facility].  I left a msg requesting she return my call if she's interested in applying for the grant.

## 2022-03-18 ENCOUNTER — Other Ambulatory Visit: Payer: Self-pay

## 2022-03-23 LAB — MULTIPLE MYELOMA PANEL, SERUM
Albumin SerPl Elph-Mcnc: 3.7 g/dL (ref 2.9–4.4)
Albumin/Glob SerPl: 1.4 (ref 0.7–1.7)
Alpha 1: 0.2 g/dL (ref 0.0–0.4)
Alpha2 Glob SerPl Elph-Mcnc: 0.6 g/dL (ref 0.4–1.0)
B-Globulin SerPl Elph-Mcnc: 1.3 g/dL (ref 0.7–1.3)
Gamma Glob SerPl Elph-Mcnc: 0.6 g/dL (ref 0.4–1.8)
Globulin, Total: 2.7 g/dL (ref 2.2–3.9)
IgA: 478 mg/dL — ABNORMAL HIGH (ref 87–352)
IgG (Immunoglobin G), Serum: 806 mg/dL (ref 586–1602)
IgM (Immunoglobulin M), Srm: 74 mg/dL (ref 26–217)
Total Protein ELP: 6.4 g/dL (ref 6.0–8.5)

## 2022-03-25 ENCOUNTER — Telehealth: Payer: Self-pay | Admitting: Internal Medicine

## 2022-03-25 NOTE — Telephone Encounter (Signed)
Called patient regarding February and March appointments. Patient is notified.

## 2022-03-31 DIAGNOSIS — K080 Exfoliation of teeth due to systemic causes: Secondary | ICD-10-CM | POA: Diagnosis not present

## 2022-04-03 NOTE — Progress Notes (Unsigned)
[facility] OFFICE PROGRESS NOTE  [name], MD [address] [place] [address]  DIAGNOSIS:  Kidney cancer diagnosed in September 2023.  She was found to have T3a clear-cell renal cell carcinoma without any evidence of metastatic disease.    PRIOR THERAPY: She is  S/P robotic assisted laparoscopic right radical nephrectomy on November 03, 2021. The final pathology showed clear-cell renal cell carcinoma nuclear grade 2 measuring 3.5 cm with tumor extending into the renal vein indicating T3a disease.    CURRENT THERAPY: Pembrolizumab 200 mg every 3 weeks started on December 23, 2021.  She is here for cycle 6 out of planned 17 treatments.    INTERVAL HISTORY: Cassidy Terry 67 y.o. female returns to the clinic today for a follow-up visit.  The patient was last seen by myself on 03/17/2022.  The patient is currently undergoing immunotherapy with Keytruda 200 mg IV every 3 weeks.  At her last appointment, we discussed a flareup of a rash on her hands and body.  As discussed at her previous appointment, she does have a history of psoriasis which typically flares up in the summer and she sees Dr. Dian Situ from [facility] dermatology.  She has a history of using steroid cream during the summer but has not used it recently.  Therefore when we last saw her she was instructed to use her steroid cream, and she has been using Zyrtec. She states the zyrtec helps with itching. She states the rash is about the same at this time. She states she is learning to manage it. We discussed at her last appointment if this worsens that we may need to discuss high-dose steroid and may need to discontinue her immunotherapy.   Otherwise she denies any fever, chills, night sweats, or unexplained weight loss.  Denies any nausea, vomiting, diarrhea, or constipation.  Denies any changes with urination such as hematuria, dysuria, or malodorous urine.  She mentions the day after receiving  treatment that she sometimes has burning with urination. Denies any abdominal pain or back pain.  Denies any chest pain, shortness of breath, cough, or hemoptysis.  She is here today for evaluation repeat blood work before undergoing cycle #6.  MEDICAL HISTORY: Past Medical History:  Diagnosis Date   Arthritis    Depression    Hypertension    Hypothyroidism    Psoriasis    Sleep apnea    Mild without the use of a cpap    ALLERGIES:  is allergic to latex, monascus purpureus went yeast, simvastatin, and sulfa antibiotics.  MEDICATIONS:  Current Outpatient Medications  Medication Sig Dispense Refill   Cetirizine HCl (ZYRTEC ALLERGY PO)      escitalopram (LEXAPRO) 10 MG tablet Take 10 mg by mouth daily.     ezetimibe (ZETIA) 10 MG tablet Take 10 mg by mouth daily.     Investigational - Study Medication Inject 3 mg into the skin every 6 (six) months. [place] Study Zilebesiran, [id number], (IDS 2501) -eIRB [phone] injection     levothyroxine (SYNTHROID) 25 MCG tablet Take 25 mcg by mouth daily before breakfast.     No current facility-administered medications for this visit.    SURGICAL HISTORY:  Past Surgical History:  Procedure Laterality Date   ABDOMINAL HYSTERECTOMY     TAH FOR HEMATOMETRIA   BELPHAROPTOSIS REPAIR     Bilateral   CARPAL TUNNEL RELEASE Left 02/24/2018   Procedure: LEFT CARPAL TUNNEL RELEASE;  Surgeon: Daryll Brod, MD;  [facility]: [facility];  Service: Orthopedics;  Laterality: Left;   CESAREAN SECTION     X2   CHOLECYSTECTOMY N/A 07/20/2021   Procedure: LAPAROSCOPIC CHOLECYSTECTOMY;  Surgeon: Greer Pickerel, MD;  Location: [facility];  Service: General;  Laterality: N/A;   DILATION AND CURETTAGE OF UTERUS     X2   ENDOMETRIAL ABLATION     ENDOMYORESECTION   FOOT SURGERY  10/2010   rt. foot, 2nd toe, 1 pin to stay in   [place] Left 02/13/2020   Procedure: EXCISION SUBCUTANEOUS BUTTOCK LIPOMA;  Surgeon: Rolm Bookbinder, MD;  Location: [facility];  Service: General;  Laterality: Left;  GEN AND LMA   ROBOT ASSISTED LAPAROSCOPIC NEPHRECTOMY Right 11/03/2021   Procedure: XI ROBOTIC ASSISTED LAPAROSCOPIC NEPHRECTOMY;  Surgeon: Janith Lima, MD;  [facility];  Service: Urology;  Laterality: Right;   TONSILLECTOMY     TUBAL LIGATION     LTL-FULGURATION/TRANSECTION    REVIEW OF SYSTEMS:   Review of Systems  Constitutional: Negative for appetite change, chills, fatigue, fever and unexpected weight change.  HENT: Negative for mouth sores, nosebleeds, sore throat and trouble swallowing.  Eyes: Negative for eye problems and icterus.  Respiratory: Negative for cough, hemoptysis, shortness of breath and wheezing.   Cardiovascular: Negative for chest pain and leg swelling.  Gastrointestinal: Negative for abdominal pain, constipation, diarrhea, nausea and vomiting.  Genitourinary: Negative for bladder incontinence, difficulty urinating, dysuria, frequency and hematuria.   Musculoskeletal: Negative for back pain, gait problem, neck pain and neck stiffness.  Skin: Positive for rash and itching. Neurological: Negative for dizziness, extremity weakness, gait problem, headaches, light-headedness and seizures.  Hematological: Negative for adenopathy. Does not bruise/bleed easily.  Psychiatric/Behavioral: Negative for confusion, depression and sleep disturbance. The patient is not nervous/anxious.   PHYSICAL EXAMINATION:  Blood pressure (!) 123/58, pulse 71, temperature 97.6 F (36.4 C), temperature source Temporal, resp. rate 17, height '5\' 7"'$  (1.702 m), weight 174 lb 8 oz (79.2 kg), last menstrual period 11/06/2002, SpO2 100 %.  ECOG PERFORMANCE STATUS: 1  Physical Exam  Constitutional: Oriented to person, place, and time and well-developed, well-nourished, and in no distress.  HENT:  Head: Normocephalic and atraumatic.  Mouth/Throat: Oropharynx is clear and moist. No oropharyngeal exudate.  Eyes: Conjunctivae are normal.  Right eye exhibits no discharge. Left eye exhibits no discharge. No scleral icterus.  Neck: Normal range of motion. Neck supple.  Cardiovascular: Normal rate, regular rhythm, normal heart sounds and intact distal pulses.   Pulmonary/Chest: Effort normal and breath sounds normal. No respiratory distress. No wheezes. No rales.  Abdominal: Soft. Bowel sounds are normal. Exhibits no distension and no mass. There is no tenderness.  Musculoskeletal: Normal range of motion. Exhibits no edema.  Lymphadenopathy:    No cervical adenopathy.  Neurological: Alert and oriented to person, place, and time. Exhibits normal muscle tone. Gait normal. Coordination normal.  Skin: Skin is warm and dry.  Plaques on hands bilaterally.  Has small scattered skin lesions across body.  Not diaphoretic. No erythema. No pallor.   LABORATORY DATA: Lab Results  Component Value Date   WBC 6.4 04/07/2022   HGB 13.6 04/07/2022   HCT 41.3 04/07/2022   MCV 95.2 04/07/2022   PLT 240 04/07/2022      Chemistry      Component Value Date/Time   NA 140 04/07/2022 0749   K 4.0 04/07/2022 0749   CL 107 04/07/2022 0749   CO2 25 04/07/2022 0749   BUN 22 04/07/2022 0749  CREATININE 1.01 (H) 04/07/2022 0749      Component Value Date/Time   CALCIUM 8.9 04/07/2022 0749   ALKPHOS 83 04/07/2022 0749   AST 17 04/07/2022 0749   ALT 13 04/07/2022 0749   BILITOT 0.3 04/07/2022 0749       RADIOGRAPHIC STUDIES:  No results found.   ASSESSMENT/PLAN:  This is a very pleasant 67 year old Caucasian female diagnosed with renal cell carcinoma.  She was diagnosed with T3a grade 2 tumor with a tumor invading the renal veins segmental branch.  She was diagnosed in September 2023.   The patient is status post robotic assisted laparoscopic right radical nephrectomy on 11/03/2021 for which the final pathology showed clear-cell renal cell carcinoma nuclear grade 2 with tumor extending in the renal veins indicating T3a disease.   The  patient is currently undergoing immunotherapy with Keytruda 200 mg IV every 3 weeks.  She is status post 5 cycles.  The plan is complete 17 cycles of therapy or close to 1 year.    Recently, Beryle Flock has been flaring up her psoriasis. She now has a skin rash. She has been using zyrtec and lotion. She states it is stable and she is managing it. .    Labs were reviewed. Recommend she proceed with cycle #6 as scheduled. She knows to call if she has worsening rash for further instructions, which will likely be steroids.   Will see her back for follow-up visit in 3 weeks for evaluation repeat blood work before undergoing cycle #7   Per Dr. Julien Nordmann, we will arrange for a restaging CT scan after cycle #8 or 9.   The patient was advised to call immediately if she has any concerning symptoms in the interval. The patient voices understanding of current disease status and treatment options and is in agreement with the current care plan. All questions were answered. The patient knows to call the clinic with any problems, questions or concerns. We can certainly see the patient much sooner if necessary      No orders of the defined types were placed in this encounter.    The total time spent in the appointment was 20-29 minutes.   Deasiah Hagberg L Davy [place], PA-C 04/07/22

## 2022-04-07 ENCOUNTER — Inpatient Hospital Stay: Payer: Medicare Other

## 2022-04-07 ENCOUNTER — Ambulatory Visit: Payer: Medicare Other | Admitting: Internal Medicine

## 2022-04-07 ENCOUNTER — Other Ambulatory Visit: Payer: Medicare Other

## 2022-04-07 ENCOUNTER — Ambulatory Visit: Payer: Medicare Other | Admitting: Physician Assistant

## 2022-04-07 ENCOUNTER — Inpatient Hospital Stay (HOSPITAL_BASED_OUTPATIENT_CLINIC_OR_DEPARTMENT_OTHER): Payer: Medicare Other | Admitting: Physician Assistant

## 2022-04-07 ENCOUNTER — Ambulatory Visit: Payer: Medicare Other

## 2022-04-07 DIAGNOSIS — C649 Malignant neoplasm of unspecified kidney, except renal pelvis: Secondary | ICD-10-CM

## 2022-04-07 DIAGNOSIS — Z79899 Other long term (current) drug therapy: Secondary | ICD-10-CM | POA: Diagnosis not present

## 2022-04-07 DIAGNOSIS — Z1231 Encounter for screening mammogram for malignant neoplasm of breast: Secondary | ICD-10-CM | POA: Diagnosis not present

## 2022-04-07 DIAGNOSIS — C641 Malignant neoplasm of right kidney, except renal pelvis: Secondary | ICD-10-CM | POA: Diagnosis not present

## 2022-04-07 DIAGNOSIS — C7989 Secondary malignant neoplasm of other specified sites: Secondary | ICD-10-CM | POA: Diagnosis not present

## 2022-04-07 DIAGNOSIS — Z5112 Encounter for antineoplastic immunotherapy: Secondary | ICD-10-CM | POA: Diagnosis not present

## 2022-04-07 LAB — CBC WITH DIFFERENTIAL (CANCER CENTER ONLY)
Abs Immature Granulocytes: 0.02 10*3/uL (ref 0.00–0.07)
Basophils Absolute: 0 10*3/uL (ref 0.0–0.1)
Basophils Relative: 1 %
Eosinophils Absolute: 0.2 10*3/uL (ref 0.0–0.5)
Eosinophils Relative: 3 %
HCT: 41.3 % (ref 36.0–46.0)
Hemoglobin: 13.6 g/dL (ref 12.0–15.0)
Immature Granulocytes: 0 %
Lymphocytes Relative: 28 %
Lymphs Abs: 1.8 10*3/uL (ref 0.7–4.0)
MCH: 31.3 pg (ref 26.0–34.0)
MCHC: 32.9 g/dL (ref 30.0–36.0)
MCV: 95.2 fL (ref 80.0–100.0)
Monocytes Absolute: 0.6 10*3/uL (ref 0.1–1.0)
Monocytes Relative: 10 %
Neutro Abs: 3.8 10*3/uL (ref 1.7–7.7)
Neutrophils Relative %: 58 %
Platelet Count: 240 10*3/uL (ref 150–400)
RBC: 4.34 MIL/uL (ref 3.87–5.11)
RDW: 13.2 % (ref 11.5–15.5)
WBC Count: 6.4 10*3/uL (ref 4.0–10.5)
nRBC: 0 % (ref 0.0–0.2)

## 2022-04-07 LAB — CMP (CANCER CENTER ONLY)
ALT: 13 U/L (ref 0–44)
AST: 17 U/L (ref 15–41)
Albumin: 3.8 g/dL (ref 3.5–5.0)
Alkaline Phosphatase: 83 U/L (ref 38–126)
Anion gap: 8 (ref 5–15)
BUN: 22 mg/dL (ref 8–23)
CO2: 25 mmol/L (ref 22–32)
Calcium: 8.9 mg/dL (ref 8.9–10.3)
Chloride: 107 mmol/L (ref 98–111)
Creatinine: 1.01 mg/dL — ABNORMAL HIGH (ref 0.44–1.00)
GFR, Estimated: >60 mL/min (ref >60)
Glucose, Bld: 105 mg/dL — ABNORMAL HIGH (ref 70–99)
Potassium: 4 mmol/L (ref 3.5–5.1)
Sodium: 140 mmol/L (ref 135–145)
Total Bilirubin: 0.3 mg/dL (ref 0.3–1.2)
Total Protein: 6.8 g/dL (ref 6.5–8.1)

## 2022-04-07 LAB — TSH: TSH: 2.427 u[IU]/mL (ref 0.350–4.500)

## 2022-04-07 MED ORDER — SODIUM CHLORIDE 0.9 % IV SOLN: Freq: Once | INTRAVENOUS | Status: AC

## 2022-04-07 MED ORDER — SODIUM CHLORIDE 0.9 % IV SOLN
200.0000 mg | Freq: Once | INTRAVENOUS | Status: AC
  Administered 2022-04-07: 200 mg via INTRAVENOUS
  Filled 2022-04-07: qty 8

## 2022-04-07 NOTE — Patient Instructions (Signed)
[facility]  Discharge Instructions: Thank you for choosing [facility] to provide your oncology and hematology care.   If you have a lab appointment with the [facility], please go directly to the [facility] and check in at the registration area.   Wear comfortable clothing and clothing appropriate for easy access to any Portacath or PICC line.   We strive to give you quality time with your provider. You may need to reschedule your appointment if you arrive late (15 or more minutes).  Arriving late affects you and other patients whose appointments are after yours.  Also, if you miss three or more appointments without notifying the office, you may be dismissed from the clinic at the provider's discretion.      For prescription refill requests, have your pharmacy contact our office and allow 72 hours for refills to be completed.    Today you received the following chemotherapy and/or immunotherapy agents Keytruda      To help prevent nausea and vomiting after your treatment, we encourage you to take your nausea medication as directed.  BELOW ARE SYMPTOMS THAT SHOULD BE REPORTED IMMEDIATELY: *FEVER GREATER THAN 100.4 F (38 C) OR HIGHER *CHILLS OR SWEATING *NAUSEA AND VOMITING THAT IS NOT CONTROLLED WITH YOUR NAUSEA MEDICATION *UNUSUAL SHORTNESS OF BREATH *UNUSUAL BRUISING OR BLEEDING *URINARY PROBLEMS (pain or burning when urinating, or frequent urination) *BOWEL PROBLEMS (unusual diarrhea, constipation, pain near the anus) TENDERNESS IN MOUTH AND THROAT WITH OR WITHOUT PRESENCE OF ULCERS (sore throat, sores in mouth, or a toothache) UNUSUAL RASH, SWELLING OR PAIN  UNUSUAL VAGINAL DISCHARGE OR ITCHING   Items with * indicate a potential emergency and should be followed up as soon as possible or go to the Emergency Department if any problems should occur.  Please show the CHEMOTHERAPY ALERT CARD or IMMUNOTHERAPY ALERT CARD at  check-in to the Emergency Department and triage nurse.  Should you have questions after your visit or need to cancel or reschedule your appointment, please contact [facility]  Dept: [phone]  and follow the prompts.  Office hours are 8:00 a.m. to 4:30 p.m. Monday - Friday. Please note that voicemails left after 4:00 p.m. may not be returned until the following business day.  We are closed weekends and major holidays. You have access to a nurse at all times for urgent questions. Please call the main number to the clinic Dept: [phone] and follow the prompts.   For any non-urgent questions, you may also contact your provider using MyChart. We now offer e-Visits for anyone 63 and older to request care online for non-urgent symptoms. For details visit [email or web address].   Also download the MyChart app! Go to the app store, search "MyChart", open the app, select [facility], and log in with your MyChart username and password.

## 2022-04-07 NOTE — Progress Notes (Signed)
Patient seen by PA today  Vitals are within treatment parameters.  Labs reviewed: and are within treatment parameters.  Per physician team, patient is ready for treatment and there are NO modifications to the treatment plan.

## 2022-04-08 DIAGNOSIS — Z86010 Personal history of colonic polyps: Secondary | ICD-10-CM | POA: Diagnosis not present

## 2022-04-08 DIAGNOSIS — C641 Malignant neoplasm of right kidney, except renal pelvis: Secondary | ICD-10-CM | POA: Diagnosis not present

## 2022-04-09 LAB — T4: T4, Total: 7 ug/dL (ref 4.5–12.0)

## 2022-04-13 LAB — MULTIPLE MYELOMA PANEL, SERUM
Albumin SerPl Elph-Mcnc: 3.7 g/dL (ref 2.9–4.4)
Albumin/Glob SerPl: 1.4 (ref 0.7–1.7)
Alpha 1: 0.2 g/dL (ref 0.0–0.4)
Alpha2 Glob SerPl Elph-Mcnc: 0.7 g/dL (ref 0.4–1.0)
B-Globulin SerPl Elph-Mcnc: 1.1 g/dL (ref 0.7–1.3)
Gamma Glob SerPl Elph-Mcnc: 0.8 g/dL (ref 0.4–1.8)
Globulin, Total: 2.8 g/dL (ref 2.2–3.9)
IgA: 493 mg/dL — ABNORMAL HIGH (ref 87–352)
IgG (Immunoglobin G), Serum: 817 mg/dL (ref 586–1602)
IgM (Immunoglobulin M), Srm: 82 mg/dL (ref 26–217)
Total Protein ELP: 6.5 g/dL (ref 6.0–8.5)

## 2022-04-24 ENCOUNTER — Other Ambulatory Visit: Payer: Self-pay | Admitting: Physician Assistant

## 2022-04-24 ENCOUNTER — Telehealth: Payer: Self-pay | Admitting: Internal Medicine

## 2022-04-24 DIAGNOSIS — C649 Malignant neoplasm of unspecified kidney, except renal pelvis: Secondary | ICD-10-CM

## 2022-04-24 NOTE — Progress Notes (Deleted)
[facility] OFFICE PROGRESS NOTE  Cassidy Jordan, MD [address] [place] [address]  DIAGNOSIS: Kidney cancer diagnosed in September 2023.  She was found to have T3a clear-cell renal cell carcinoma without any evidence of metastatic disease.    PRIOR THERAPY: She is  S/P robotic assisted laparoscopic right radical nephrectomy on November 03, 2021. The final pathology showed clear-cell renal cell carcinoma nuclear grade 2 measuring 3.5 cm with tumor extending into the renal vein indicating T3a disease.    CURRENT THERAPY: Pembrolizumab 200 mg every 3 weeks started on December 23, 2021.  She is here for cycle 7 out of planned 17 treatments.    INTERVAL HISTORY: Cassidy Terry 67 y.o. female returns to the clinic today for a follow-up visit.  The patient was last seen by myself on 227/2024.    The patient is currently undergoing immunotherapy with Keytruda 200 mg IV every 3 weeks.  At her appointment on 03/17/22, we discussed a flareup of a rash on her hands and body.  As discussed at her previous appointment, she does have a history of psoriasis which typically flares up in the summer and she sees Dr. Dian Terry from [facility] dermatology.  She has a history of using steroid cream during the summer but has not used it recently.  Therefore when we last saw her she was instructed to use her steroid cream, and she has been using Zyrtec. She states the zyrtec helps with itching. She states the rash is about the same at this time. She states she is learning to manage it. We discussed at her last appointment if this worsens that we may need to discuss high-dose steroid and may need to discontinue her immunotherapy.   Otherwise she denies any fever, chills, night sweats, or unexplained weight loss.  Denies any nausea, vomiting, diarrhea, or constipation.  Denies any changes with urination such as hematuria, dysuria, or malodorous urine.  She mentions the day after receiving  treatment that she sometimes has burning with urination. Denies any abdominal pain or back pain.  Denies any chest pain, shortness of breath, cough, or hemoptysis.  She is here today for evaluation repeat blood work before undergoing cycle #7.   MEDICAL HISTORY: Past Medical History:  Diagnosis Date   Arthritis    Depression    Hypertension    Hypothyroidism    Psoriasis    Sleep apnea    Mild without the use of a cpap    ALLERGIES:  is allergic to latex, monascus purpureus went yeast, simvastatin, and sulfa antibiotics.  MEDICATIONS:  Current Outpatient Medications  Medication Sig Dispense Refill   Cetirizine HCl (ZYRTEC ALLERGY PO)      escitalopram (LEXAPRO) 10 MG tablet Take 10 mg by mouth daily.     ezetimibe (ZETIA) 10 MG tablet Take 10 mg by mouth daily.     Investigational - Study Medication Inject 3 mg into the skin every 6 (six) months. [place] Study Zilebesiran, ALN-AGT01, (IDS 2501) -eIRB [phone] injection     levothyroxine (SYNTHROID) 25 MCG tablet Take 25 mcg by mouth daily before breakfast.     No current facility-administered medications for this visit.    SURGICAL HISTORY:  Past Surgical History:  Procedure Laterality Date   ABDOMINAL HYSTERECTOMY     TAH FOR HEMATOMETRIA   BELPHAROPTOSIS REPAIR     Bilateral   CARPAL TUNNEL RELEASE Left 02/24/2018   Procedure: LEFT CARPAL TUNNEL RELEASE;  Surgeon: Daryll Brod, MD;  Location: [facility];  Service: Orthopedics;  Laterality: Left;   CESAREAN SECTION     X2   CHOLECYSTECTOMY N/A 07/20/2021   Procedure: LAPAROSCOPIC CHOLECYSTECTOMY;  Surgeon: Greer Pickerel, MD;  Location: [facility];  Service: General;  Laterality: N/A;   DILATION AND CURETTAGE OF UTERUS     X2   ENDOMETRIAL ABLATION     ENDOMYORESECTION   FOOT SURGERY  10/2010   rt. foot, 2nd toe, 1 pin to stay in   [place] Left 02/13/2020   Procedure: EXCISION SUBCUTANEOUS BUTTOCK LIPOMA;  Surgeon: Rolm Bookbinder, MD;  Location: [facility];  Service: General;  Laterality: Left;  GEN AND LMA   ROBOT ASSISTED LAPAROSCOPIC NEPHRECTOMY Right 11/03/2021   Procedure: XI ROBOTIC ASSISTED LAPAROSCOPIC NEPHRECTOMY;  Surgeon: Janith Lima, MD;  Location: [facility];  Service: Urology;  Laterality: Right;   TONSILLECTOMY     TUBAL LIGATION     LTL-FULGURATION/TRANSECTION    REVIEW OF SYSTEMS:   Review of Systems  Constitutional: Negative for appetite change, chills, fatigue, fever and unexpected weight change.  HENT:   Negative for mouth sores, nosebleeds, sore throat and trouble swallowing.   Eyes: Negative for eye problems and icterus.  Respiratory: Negative for cough, hemoptysis, shortness of breath and wheezing.   Cardiovascular: Negative for chest pain and leg swelling.  Gastrointestinal: Negative for abdominal pain, constipation, diarrhea, nausea and vomiting.  Genitourinary: Negative for bladder incontinence, difficulty urinating, dysuria, frequency and hematuria.   Musculoskeletal: Negative for back pain, gait problem, neck pain and neck stiffness.  Skin: Negative for itching and rash.  Neurological: Negative for dizziness, extremity weakness, gait problem, headaches, light-headedness and seizures.  Hematological: Negative for adenopathy. Does not bruise/bleed easily.  Psychiatric/Behavioral: Negative for confusion, depression and sleep disturbance. The patient is not nervous/anxious.     PHYSICAL EXAMINATION:  Last menstrual period 11/06/2002.  ECOG PERFORMANCE STATUS: {CHL ONC ECOG [id number]  Physical Exam  Constitutional: Oriented to person, place, and time and well-developed, well-nourished, and in no distress. No distress.  HENT:  Head: Normocephalic and atraumatic.  Mouth/Throat: Oropharynx is clear and moist. No oropharyngeal exudate.  Eyes: Conjunctivae are normal. Right eye exhibits no discharge. Left eye exhibits no discharge. No scleral icterus.  Neck: Normal range of motion. Neck supple.   Cardiovascular: Normal rate, regular rhythm, normal heart sounds and intact distal pulses.   Pulmonary/Chest: Effort normal and breath sounds normal. No respiratory distress. No wheezes. No rales.  Abdominal: Soft. Bowel sounds are normal. Exhibits no distension and no mass. There is no tenderness.  Musculoskeletal: Normal range of motion. Exhibits no edema.  Lymphadenopathy:    No cervical adenopathy.  Neurological: Alert and oriented to person, place, and time. Exhibits normal muscle tone. Gait normal. Coordination normal.  Skin: Skin is warm and dry. No rash noted. Not diaphoretic. No erythema. No pallor.  Psychiatric: Mood, memory and judgment normal.  Vitals reviewed.  LABORATORY DATA: Lab Results  Component Value Date   WBC 6.4 04/07/2022   HGB 13.6 04/07/2022   HCT 41.3 04/07/2022   MCV 95.2 04/07/2022   PLT 240 04/07/2022      Chemistry      Component Value Date/Time   NA 140 04/07/2022 0749   K 4.0 04/07/2022 0749   CL 107 04/07/2022 0749   CO2 25 04/07/2022 0749   BUN 22 04/07/2022 0749   CREATININE 1.01 (H) 04/07/2022 0749      Component Value Date/Time   CALCIUM 8.9 04/07/2022  0749   ALKPHOS 83 04/07/2022 0749   AST 17 04/07/2022 0749   ALT 13 04/07/2022 0749   BILITOT 0.3 04/07/2022 0749       RADIOGRAPHIC STUDIES:  No results found.   ASSESSMENT/PLAN:  This is a very pleasant 67 year old Caucasian female diagnosed with renal cell carcinoma.  She was diagnosed with T3a grade 2 tumor with a tumor invading the renal veins segmental branch.  She was diagnosed in September 2023.    The patient is status post robotic assisted laparoscopic right radical nephrectomy on 11/03/2021 for which the final pathology showed clear-cell renal cell carcinoma nuclear grade 2 with tumor extending in the renal veins indicating T3a disease.   The patient is currently undergoing immunotherapy with Keytruda 200 mg IV every 3 weeks.  She is status post 6 cycles.  The plan is  complete 17 cycles of therapy or close to 1 year.    Recently, Beryle Flock has been flaring up her psoriasis. She now has a skin rash. She has been using zyrtec and lotion. She states it is stable and she is managing it. .     Labs were reviewed. Recommend she proceed with cycle #7 as scheduled. She knows to call if she has worsening rash for further instructions, which will likely be steroids.    Will see her back for follow-up visit in 3 weeks for evaluation repeat blood work before undergoing cycle #8.    Per Dr. Julien Nordmann, we will arrange for a restaging CT scan after cycle #8 or 9.   The patient was advised to call immediately if she has any concerning symptoms in the interval. The patient voices understanding of current disease status and treatment options and is in agreement with the current care plan. All questions were answered. The patient knows to call the clinic with any problems, questions or concerns. We can certainly see the patient much sooner if necessary      No orders of the defined types were placed in this encounter.    I spent {CHL ONC TIME VISIT - [id number] counseling the patient face to face. The total time spent in the appointment was {CHL ONC TIME VISIT - ZX:[id number].  Stanislaw Acton L Adanya Sosinski, PA-C 04/24/22

## 2022-04-24 NOTE — Telephone Encounter (Signed)
Called patient regarding upcoming March/April/May appointments. Patient is notified.

## 2022-04-28 ENCOUNTER — Other Ambulatory Visit: Payer: Medicare Other

## 2022-04-28 ENCOUNTER — Ambulatory Visit: Payer: Medicare Other | Admitting: Physician Assistant

## 2022-04-28 ENCOUNTER — Ambulatory Visit: Payer: Medicare Other

## 2022-04-30 NOTE — Progress Notes (Signed)
[facility] OFFICE PROGRESS NOTE  Cassidy Jordan, MD [address] [place] [address]  DIAGNOSIS: Kidney cancer diagnosed in September 2023.  She was found to have T3a clear-cell renal cell carcinoma without any evidence of metastatic disease.    PRIOR THERAPY: She is  S/P robotic assisted laparoscopic right radical nephrectomy on November 03, 2021. The final pathology showed clear-cell renal cell carcinoma nuclear grade 2 measuring 3.5 cm with tumor extending into the renal vein indicating T3a disease.    CURRENT THERAPY: Pembrolizumab 200 mg every 3 weeks started on December 23, 2021.  She is here for cycle 7 out of planned 17 treatments.    INTERVAL HISTORY: Cassidy Terry 67 y.o. female returns to the clinic today for a follow-up visit.  The patient was last seen by myself on 04/07/2022.  The patient is currently undergoing immunotherapy with Keytruda 200 mg IV every 3 weeks.  At her last appointment, we discussed a flareup of a rash on her hands and body.  As discussed at her previous appointment, she does have a history of psoriasis which typically flares up in the summer and she sees Dr. Dian Situ from [facility] dermatology.  Dr. Julien Nordmann previously discussed that in general, immunotherapy can flareup psoriasis so should her rash worsen in the future, we may need to discontinue treatment.  In the interval since last being seen, the patient had worsening flareup of her psoriasis on her entire body.  Otherwise she denies any fever, chills, or night sweats.  She lost about 4 pounds since last being seen.  Denies any nausea, vomiting, or diarrhea.  She has had a little bit more constipation recently for which she is been using prunes.  He also has some associated decreased appetite and abdominal cramping that last briefly before subsiding without any intervention.  Denies any changes with urination such as hematuria, dysuria, or malodorous urine.  She reports she  was doing some heavy lifting on Friday and has a little bit of midthoracic soreness for which she took Tylenol which helped some. Denies any chest pain, shortness of breath, cough, or hemoptysis.  She states she is scheduled to see her urologist and have a follow-up CT scan in 1 to 2 weeks.  She is here today for evaluation repeat blood work before undergoing cycle #7   MEDICAL HISTORY: Past Medical History:  Diagnosis Date   Arthritis    Depression    Hypertension    Hypothyroidism    Psoriasis    Sleep apnea    Mild without the use of a cpap    ALLERGIES:  is allergic to latex, monascus purpureus went yeast, simvastatin, and sulfa antibiotics.  MEDICATIONS:  Current Outpatient Medications  Medication Sig Dispense Refill   Cetirizine HCl (ZYRTEC ALLERGY PO)      escitalopram (LEXAPRO) 10 MG tablet Take 10 mg by mouth daily.     ezetimibe (ZETIA) 10 MG tablet Take 10 mg by mouth daily.     levothyroxine (SYNTHROID) 25 MCG tablet Take 25 mcg by mouth daily before breakfast.     No current facility-administered medications for this visit.    SURGICAL HISTORY:  Past Surgical History:  Procedure Laterality Date   ABDOMINAL HYSTERECTOMY     TAH FOR HEMATOMETRIA   BELPHAROPTOSIS REPAIR     Bilateral   CARPAL TUNNEL RELEASE Left 02/24/2018   Procedure: LEFT CARPAL TUNNEL RELEASE;  Surgeon: Daryll Brod, MD;  Location: [facility];  Service:  Orthopedics;  Laterality: Left;   CESAREAN SECTION     X2   CHOLECYSTECTOMY N/A 07/20/2021   Procedure: LAPAROSCOPIC CHOLECYSTECTOMY;  Surgeon: Greer Pickerel, MD;  Location: [facility];  Service: General;  Laterality: N/A;   DILATION AND CURETTAGE OF UTERUS     X2   ENDOMETRIAL ABLATION     ENDOMYORESECTION   FOOT SURGERY  10/2010   rt. foot, 2nd toe, 1 pin to stay in   [place] Left 02/13/2020   Procedure: EXCISION SUBCUTANEOUS BUTTOCK LIPOMA;  Surgeon: Rolm Bookbinder, MD;  Location: [facility];  Service:  General;  Laterality: Left;  GEN AND LMA   ROBOT ASSISTED LAPAROSCOPIC NEPHRECTOMY Right 11/03/2021   Procedure: XI ROBOTIC ASSISTED LAPAROSCOPIC NEPHRECTOMY;  Surgeon: Janith Lima, MD;  [facility];  Service: Urology;  Laterality: Right;   TONSILLECTOMY     TUBAL LIGATION     LTL-FULGURATION/TRANSECTION    REVIEW OF SYSTEMS:   Review of Systems  Constitutional: Positive for increased fatigue, decreased appetite, and 4 pound weight loss. Negative for appetite change, chills, and fever.  HENT:  Negative for mouth sores, nosebleeds, sore throat and trouble swallowing.   Eyes: Negative for eye problems and icterus.  Respiratory: Negative for cough, hemoptysis, shortness of breath and wheezing.   Cardiovascular: Negative for chest pain and leg swelling.  Gastrointestinal: Positive for increased constipation and mild associated cramping.  Negative for diarrhea, nausea and vomiting.  Genitourinary: Negative for bladder incontinence, difficulty urinating, dysuria, frequency and hematuria.   Musculoskeletal: Positive for mild mid thoracic back pain after heavy lifting on Friday.  Negative for gait problem, neck pain and neck stiffness.  Skin: Positive for itching and rash. Neurological: Negative for dizziness, extremity weakness, gait problem, headaches, light-headedness and seizures.  Hematological: Negative for adenopathy. Does not bruise/bleed easily.  Psychiatric/Behavioral: Negative for confusion, depression and sleep disturbance. The patient is not nervous/anxious.     PHYSICAL EXAMINATION:  Blood pressure 132/69, pulse 72, temperature 97.8 F (36.6 C), temperature source Temporal, resp. rate 16, height 5\' 7"  (1.702 m), weight 170 lb 8 oz (77.3 kg), last menstrual period 11/06/2002, SpO2 100 %.  ECOG PERFORMANCE STATUS: 1  Physical Exam  Constitutional: Oriented to person, place, and time and well-developed, well-nourished, and in no distress.   HENT:  Head: Normocephalic and  atraumatic.  Mouth/Throat: Oropharynx is clear and moist. No oropharyngeal exudate.  Eyes: Conjunctivae are normal. Right eye exhibits no discharge. Left eye exhibits no discharge. No scleral icterus.  Neck: Normal range of motion. Neck supple.  Cardiovascular: Normal rate, regular rhythm, normal heart sounds and intact distal pulses.   Pulmonary/Chest: Effort normal and breath sounds normal. No respiratory distress. No wheezes. No rales.  Abdominal: Soft. Bowel sounds are normal. Exhibits no distension and no mass. There is no tenderness.  Musculoskeletal: Normal range of motion. Exhibits no edema.  Negative CVA tenderness. Lymphadenopathy:    No cervical adenopathy.  Neurological: Alert and oriented to person, place, and time. Exhibits normal muscle tone. Gait normal. Coordination normal.  Skin: Positive for rash.      Skin is warm and dry. Not diaphoretic. No erythema. No pallor.  Psychiatric: Mood, memory and judgment normal.  Vitals reviewed.  LABORATORY DATA: Lab Results  Component Value Date   WBC 9.7 05/04/2022   HGB 13.7 05/04/2022   HCT 41.3 05/04/2022   MCV 94.7 05/04/2022   PLT 275 05/04/2022      Chemistry      Component Value Date/Time  NA 138 05/04/2022 0801   K 4.3 05/04/2022 0801   CL 106 05/04/2022 0801   CO2 25 05/04/2022 0801   BUN 19 05/04/2022 0801   CREATININE 0.98 05/04/2022 0801      Component Value Date/Time   CALCIUM 9.5 05/04/2022 0801   ALKPHOS 103 05/04/2022 0801   AST 43 (H) 05/04/2022 0801   ALT 39 05/04/2022 0801   BILITOT 0.6 05/04/2022 0801       RADIOGRAPHIC STUDIES:  No results found.   ASSESSMENT/PLAN:  This is a very pleasant 67 year old Caucasian female diagnosed with renal cell carcinoma.  She was diagnosed with T3a grade 2 tumor with a tumor invading the renal veins segmental branch.  She was diagnosed in September 2023.    The patient is status post robotic assisted laparoscopic right radical nephrectomy on  11/03/2021 for which the final pathology showed clear-cell renal cell carcinoma nuclear grade 2 with tumor extending in the renal veins indicating T3a disease.   The patient is currently undergoing immunotherapy with Keytruda 200 mg IV every 3 weeks.  She is status post 6 cycles.  The plan is complete 17 cycles of therapy or close to 1 year.     Recently, Beryle Flock has been flaring up her psoriasis. She now has a skin rash. She has been using zyrtec and lotion.   The patient was seen with Dr. Julien Nordmann today.  Dr. Julien Nordmann recommends holding her treatment/discontinuing her treatment due to the flareup of her psoriasis.  Dr. Julien Nordmann recommends that she follow-up with her dermatologist to discuss systemic options for her rash such as any biologic agent.  He recommends holding off on any systemic steroids for this time until she can be seen by her dermatologist.  She will continue using her topical steroid cream for now.  She is supposed to have a restaging CT scan in the next 1 to 2 weeks.  We will see her back as scheduled in 3 weeks to review her scan and arrange future follow-up appointments.  We discussed using laxatives if needed for constipation.  She has been using prunes which has been helping.   The patient was advised to call immediately if she has any concerning symptoms in the interval. The patient voices understanding of current disease status and treatment options and is in agreement with the current care plan. All questions were answered. The patient knows to call the clinic with any problems, questions or concerns. We can certainly see the patient much sooner if necessary      Orders Placed This Encounter  Procedures   CBC with Differential ([facility] Only)    Standing Status:   Future    Standing Expiration Date:   05/26/2023   CMP ([facility] only)    Standing Status:   Future    Standing Expiration Date:   05/26/2023      Tobe Sos Margorie Renner,  PA-C 05/04/22  ADDENDUM: Hematology/Oncology Attending: I had a face-to-face encounter with the patient today.  I reviewed her records, lab and recommended her care plan.  This is a very pleasant 67 years old white female diagnosed with a stage III (T3a, N0, M0) clear-cell renal cell carcinoma in September 2023.  She is status post robotic assisted laparoscopic right radical nephrectomy.  The patient started treatment with adjuvant immunotherapy with Keytruda 200 Mg IV every 3 weeks on December 23, 2021 status post 6 cycles.  She has been tolerating her treatment well except for the worsening symptoms of psoriasis. She  presented today for evaluation before proceeding with cycle #7 and her psoriasis had significant flare and she is currently asymptomatic.  She is followed by Dr. Jarome Matin for her psoriasis. I recommended for the patient to discontinue her current treatment with Keytruda at this point because of the immunotherapy mediated flare of her psoriasis. She will reach out to Dr. Ronnald Ramp for treatment of her psoriasis at this point. We will see her back for follow-up visit in 3 weeks for evaluation with repeat CT scan of the chest, abdomen and pelvis for restaging of her disease. The patient was advised to call immediately if she has any other concerning symptoms in the interval. The total time spent in the appointment was 30 minutes. Disclaimer: This note was dictated with voice recognition software. Similar sounding words can inadvertently be transcribed and may be missed upon review. Eilleen Kempf, MD

## 2022-05-04 ENCOUNTER — Inpatient Hospital Stay: Payer: Medicare Other | Attending: Oncology

## 2022-05-04 ENCOUNTER — Other Ambulatory Visit: Payer: Medicare Other

## 2022-05-04 ENCOUNTER — Inpatient Hospital Stay (HOSPITAL_BASED_OUTPATIENT_CLINIC_OR_DEPARTMENT_OTHER): Payer: Medicare Other | Admitting: Physician Assistant

## 2022-05-04 ENCOUNTER — Ambulatory Visit: Payer: Medicare Other

## 2022-05-04 ENCOUNTER — Inpatient Hospital Stay: Payer: Medicare Other

## 2022-05-04 ENCOUNTER — Ambulatory Visit: Payer: Medicare Other | Admitting: Physician Assistant

## 2022-05-04 VITALS — BP 132/69 | HR 72 | Temp 97.8°F | Resp 16 | Ht 67.0 in | Wt 170.5 lb

## 2022-05-04 DIAGNOSIS — C7989 Secondary malignant neoplasm of other specified sites: Secondary | ICD-10-CM | POA: Insufficient documentation

## 2022-05-04 DIAGNOSIS — C641 Malignant neoplasm of right kidney, except renal pelvis: Secondary | ICD-10-CM | POA: Diagnosis not present

## 2022-05-04 DIAGNOSIS — C649 Malignant neoplasm of unspecified kidney, except renal pelvis: Secondary | ICD-10-CM

## 2022-05-04 DIAGNOSIS — Z85828 Personal history of other malignant neoplasm of skin: Secondary | ICD-10-CM | POA: Diagnosis not present

## 2022-05-04 DIAGNOSIS — L404 Guttate psoriasis: Secondary | ICD-10-CM | POA: Diagnosis not present

## 2022-05-04 LAB — CBC WITH DIFFERENTIAL (CANCER CENTER ONLY)
Abs Immature Granulocytes: 0.03 10*3/uL (ref 0.00–0.07)
Basophils Absolute: 0.1 10*3/uL (ref 0.0–0.1)
Basophils Relative: 1 %
Eosinophils Absolute: 0.3 10*3/uL (ref 0.0–0.5)
Eosinophils Relative: 3 %
HCT: 41.3 % (ref 36.0–46.0)
Hemoglobin: 13.7 g/dL (ref 12.0–15.0)
Immature Granulocytes: 0 %
Lymphocytes Relative: 13 %
Lymphs Abs: 1.3 10*3/uL (ref 0.7–4.0)
MCH: 31.4 pg (ref 26.0–34.0)
MCHC: 33.2 g/dL (ref 30.0–36.0)
MCV: 94.7 fL (ref 80.0–100.0)
Monocytes Absolute: 0.7 10*3/uL (ref 0.1–1.0)
Monocytes Relative: 7 %
Neutro Abs: 7.3 10*3/uL (ref 1.7–7.7)
Neutrophils Relative %: 76 %
Platelet Count: 275 10*3/uL (ref 150–400)
RBC: 4.36 MIL/uL (ref 3.87–5.11)
RDW: 12.2 % (ref 11.5–15.5)
WBC Count: 9.7 10*3/uL (ref 4.0–10.5)
nRBC: 0 % (ref 0.0–0.2)

## 2022-05-04 LAB — CMP (CANCER CENTER ONLY)
ALT: 39 U/L (ref 0–44)
AST: 43 U/L — ABNORMAL HIGH (ref 15–41)
Albumin: 3.9 g/dL (ref 3.5–5.0)
Alkaline Phosphatase: 103 U/L (ref 38–126)
Anion gap: 7 (ref 5–15)
BUN: 19 mg/dL (ref 8–23)
CO2: 25 mmol/L (ref 22–32)
Calcium: 9.5 mg/dL (ref 8.9–10.3)
Chloride: 106 mmol/L (ref 98–111)
Creatinine: 0.98 mg/dL (ref 0.44–1.00)
GFR, Estimated: >60 mL/min (ref >60)
Glucose, Bld: 113 mg/dL — ABNORMAL HIGH (ref 70–99)
Potassium: 4.3 mmol/L (ref 3.5–5.1)
Sodium: 138 mmol/L (ref 135–145)
Total Bilirubin: 0.6 mg/dL (ref 0.3–1.2)
Total Protein: 7.3 g/dL (ref 6.5–8.1)

## 2022-05-04 LAB — TSH: TSH: 1.631 u[IU]/mL (ref 0.350–4.500)

## 2022-05-12 ENCOUNTER — Other Ambulatory Visit: Payer: Self-pay

## 2022-05-13 DIAGNOSIS — E559 Vitamin D deficiency, unspecified: Secondary | ICD-10-CM | POA: Diagnosis not present

## 2022-05-13 DIAGNOSIS — E785 Hyperlipidemia, unspecified: Secondary | ICD-10-CM | POA: Diagnosis not present

## 2022-05-13 DIAGNOSIS — Z23 Encounter for immunization: Secondary | ICD-10-CM | POA: Diagnosis not present

## 2022-05-13 DIAGNOSIS — Z905 Acquired absence of kidney: Secondary | ICD-10-CM | POA: Diagnosis not present

## 2022-05-13 DIAGNOSIS — F330 Major depressive disorder, recurrent, mild: Secondary | ICD-10-CM | POA: Diagnosis not present

## 2022-05-13 DIAGNOSIS — Z0000 Encounter for general adult medical examination without abnormal findings: Secondary | ICD-10-CM | POA: Diagnosis not present

## 2022-05-13 DIAGNOSIS — E039 Hypothyroidism, unspecified: Secondary | ICD-10-CM | POA: Diagnosis not present

## 2022-05-13 DIAGNOSIS — C641 Malignant neoplasm of right kidney, except renal pelvis: Secondary | ICD-10-CM | POA: Diagnosis not present

## 2022-05-14 ENCOUNTER — Telehealth: Payer: Self-pay | Admitting: Internal Medicine

## 2022-05-14 NOTE — Telephone Encounter (Signed)
Called patient regarding upcoming April appointments, left a voicemail. 

## 2022-05-18 DIAGNOSIS — L400 Psoriasis vulgaris: Secondary | ICD-10-CM | POA: Diagnosis not present

## 2022-05-19 DIAGNOSIS — N3000 Acute cystitis without hematuria: Secondary | ICD-10-CM | POA: Diagnosis not present

## 2022-05-19 DIAGNOSIS — J9811 Atelectasis: Secondary | ICD-10-CM | POA: Diagnosis not present

## 2022-05-19 DIAGNOSIS — N200 Calculus of kidney: Secondary | ICD-10-CM | POA: Diagnosis not present

## 2022-05-19 DIAGNOSIS — D49511 Neoplasm of unspecified behavior of right kidney: Secondary | ICD-10-CM | POA: Diagnosis not present

## 2022-05-20 DIAGNOSIS — L400 Psoriasis vulgaris: Secondary | ICD-10-CM | POA: Diagnosis not present

## 2022-05-22 DIAGNOSIS — L400 Psoriasis vulgaris: Secondary | ICD-10-CM | POA: Diagnosis not present

## 2022-05-25 DIAGNOSIS — L400 Psoriasis vulgaris: Secondary | ICD-10-CM | POA: Diagnosis not present

## 2022-05-26 ENCOUNTER — Inpatient Hospital Stay: Payer: Medicare Other

## 2022-05-26 ENCOUNTER — Other Ambulatory Visit: Payer: Self-pay

## 2022-05-26 ENCOUNTER — Ambulatory Visit: Payer: Medicare Other

## 2022-05-26 ENCOUNTER — Inpatient Hospital Stay: Payer: Medicare Other | Attending: Oncology | Admitting: Internal Medicine

## 2022-05-26 VITALS — BP 149/82 | HR 72 | Temp 98.6°F | Resp 14 | Wt 167.4 lb

## 2022-05-26 DIAGNOSIS — C649 Malignant neoplasm of unspecified kidney, except renal pelvis: Secondary | ICD-10-CM

## 2022-05-26 DIAGNOSIS — K859 Acute pancreatitis without necrosis or infection, unspecified: Secondary | ICD-10-CM | POA: Insufficient documentation

## 2022-05-26 DIAGNOSIS — D49511 Neoplasm of unspecified behavior of right kidney: Secondary | ICD-10-CM | POA: Diagnosis not present

## 2022-05-26 DIAGNOSIS — Z85528 Personal history of other malignant neoplasm of kidney: Secondary | ICD-10-CM | POA: Insufficient documentation

## 2022-05-26 DIAGNOSIS — R8271 Bacteriuria: Secondary | ICD-10-CM | POA: Diagnosis not present

## 2022-05-26 DIAGNOSIS — Z905 Acquired absence of kidney: Secondary | ICD-10-CM | POA: Diagnosis not present

## 2022-05-26 DIAGNOSIS — K861 Other chronic pancreatitis: Secondary | ICD-10-CM | POA: Diagnosis not present

## 2022-05-26 DIAGNOSIS — Z79899 Other long term (current) drug therapy: Secondary | ICD-10-CM | POA: Diagnosis not present

## 2022-05-26 DIAGNOSIS — N3000 Acute cystitis without hematuria: Secondary | ICD-10-CM | POA: Diagnosis not present

## 2022-05-26 DIAGNOSIS — N3946 Mixed incontinence: Secondary | ICD-10-CM | POA: Diagnosis not present

## 2022-05-26 DIAGNOSIS — L409 Psoriasis, unspecified: Secondary | ICD-10-CM | POA: Insufficient documentation

## 2022-05-26 LAB — CBC WITH DIFFERENTIAL (CANCER CENTER ONLY)
Abs Immature Granulocytes: 0.02 10*3/uL (ref 0.00–0.07)
Basophils Absolute: 0.1 10*3/uL (ref 0.0–0.1)
Basophils Relative: 1 %
Eosinophils Absolute: 0.4 10*3/uL (ref 0.0–0.5)
Eosinophils Relative: 5 %
HCT: 39.5 % (ref 36.0–46.0)
Hemoglobin: 13 g/dL (ref 12.0–15.0)
Immature Granulocytes: 0 %
Lymphocytes Relative: 26 %
Lymphs Abs: 2 10*3/uL (ref 0.7–4.0)
MCH: 31.3 pg (ref 26.0–34.0)
MCHC: 32.9 g/dL (ref 30.0–36.0)
MCV: 95.2 fL (ref 80.0–100.0)
Monocytes Absolute: 0.7 10*3/uL (ref 0.1–1.0)
Monocytes Relative: 9 %
Neutro Abs: 4.8 10*3/uL (ref 1.7–7.7)
Neutrophils Relative %: 59 %
Platelet Count: 258 10*3/uL (ref 150–400)
RBC: 4.15 MIL/uL (ref 3.87–5.11)
RDW: 12.5 % (ref 11.5–15.5)
WBC Count: 8 10*3/uL (ref 4.0–10.5)
nRBC: 0 % (ref 0.0–0.2)

## 2022-05-26 LAB — CMP (CANCER CENTER ONLY)
ALT: 32 U/L (ref 0–44)
AST: 24 U/L (ref 15–41)
Albumin: 3.9 g/dL (ref 3.5–5.0)
Alkaline Phosphatase: 99 U/L (ref 38–126)
Anion gap: 6 (ref 5–15)
BUN: 20 mg/dL (ref 8–23)
CO2: 28 mmol/L (ref 22–32)
Calcium: 9.7 mg/dL (ref 8.9–10.3)
Chloride: 104 mmol/L (ref 98–111)
Creatinine: 1.16 mg/dL — ABNORMAL HIGH (ref 0.44–1.00)
GFR, Estimated: 52 mL/min — ABNORMAL LOW (ref >60)
Glucose, Bld: 116 mg/dL — ABNORMAL HIGH (ref 70–99)
Potassium: 4.3 mmol/L (ref 3.5–5.1)
Sodium: 138 mmol/L (ref 135–145)
Total Bilirubin: 0.4 mg/dL (ref 0.3–1.2)
Total Protein: 7.3 g/dL (ref 6.5–8.1)

## 2022-05-26 LAB — TSH: TSH: 1.699 u[IU]/mL (ref 0.350–4.500)

## 2022-05-26 LAB — LIPASE, BLOOD: Lipase: 47 U/L (ref 11–51)

## 2022-05-26 NOTE — Progress Notes (Signed)
[facility] Telephone:([phone]   Fax:([phone]  OFFICE PROGRESS NOTE  [name], MD [address] [place] [address]  DIAGNOSIS: Kidney cancer diagnosed in September 2023.  She was found to have T3a clear-cell renal cell carcinoma without any evidence of metastatic disease.     PRIOR THERAPY:  1) S/P robotic assisted laparoscopic right radical nephrectomy on November 03, 2021. The final pathology showed clear-cell renal cell carcinoma nuclear grade 2 measuring 3.5 cm with tumor extending into the renal vein indicating T3a disease.   2) Pembrolizumab 200 mg every 3 weeks started on December 23, 2021.  She is here for cycle 7 out of planned 17 treatments.  Her treatment was discontinued secondary to flare of psoriasis.   CURRENT THERAPY: Observation.  INTERVAL HISTORY: Cassidy Terry 67 y.o. female returns to the clinic today for follow-up visit.  The patient is feeling fine today with no concerning complaints except for the persistent skin rash.  She was seen by her dermatologist and she was given cream to be applied twice a day in addition to light exposure.  She denied having any current chest pain, shortness of breath, cough or hemoptysis.  She has no nausea, vomiting, diarrhea or constipation.  She has no headache or visual changes.  She denied having any recent weight loss or night sweats.  She had repeat CT scan of the chest, abdomen and pelvis performed at the urology center and she is here for evaluation and discussion of her scan results and recommendation regarding her condition.  MEDICAL HISTORY: Past Medical History:  Diagnosis Date   Arthritis    Depression    Hypertension    Hypothyroidism    Psoriasis    Sleep apnea    Mild without the use of a cpap    ALLERGIES:  is allergic to latex, monascus purpureus went yeast, simvastatin, and sulfa antibiotics.  MEDICATIONS:  Current Outpatient Medications  Medication  Sig Dispense Refill   Cetirizine HCl (ZYRTEC ALLERGY PO)      escitalopram (LEXAPRO) 10 MG tablet Take 10 mg by mouth daily.     ezetimibe (ZETIA) 10 MG tablet Take 10 mg by mouth daily.     levothyroxine (SYNTHROID) 25 MCG tablet Take 25 mcg by mouth daily before breakfast.     No current facility-administered medications for this visit.    SURGICAL HISTORY:  Past Surgical History:  Procedure Laterality Date   ABDOMINAL HYSTERECTOMY     TAH FOR HEMATOMETRIA   BELPHAROPTOSIS REPAIR     Bilateral   CARPAL TUNNEL RELEASE Left 02/24/2018   Procedure: LEFT CARPAL TUNNEL RELEASE;  Surgeon: Cindee Salt, MD;  Location: [facility];  Service: Orthopedics;  Laterality: Left;   CESAREAN SECTION     X2   CHOLECYSTECTOMY N/A 07/20/2021   Procedure: LAPAROSCOPIC CHOLECYSTECTOMY;  Surgeon: Gaynelle Adu, MD;  Location: [facility] OR;  Service: General;  Laterality: N/A;   DILATION AND CURETTAGE OF UTERUS     X2   ENDOMETRIAL ABLATION     ENDOMYORESECTION   FOOT SURGERY  10/2010   rt. foot, 2nd toe, 1 pin to stay in   LIPOMA EXCISION Left 02/13/2020   Procedure: EXCISION SUBCUTANEOUS BUTTOCK LIPOMA;  Surgeon: Emelia Loron, MD;  Location: [facility];  Service: General;  Laterality: Left;  GEN AND LMA   ROBOT ASSISTED LAPAROSCOPIC NEPHRECTOMY Right 11/03/2021   Procedure: XI ROBOTIC ASSISTED LAPAROSCOPIC NEPHRECTOMY;  Surgeon: Cardell Peach,  Lesia Sago, MD;  Location: [facility];  Service: Urology;  Laterality: Right;   TONSILLECTOMY     TUBAL LIGATION     LTL-FULGURATION/TRANSECTION    REVIEW OF SYSTEMS:  Constitutional: negative Eyes: negative Ears, nose, mouth, throat, and face: negative Respiratory: negative Cardiovascular: negative Gastrointestinal: negative Genitourinary:negative Integument/breast: positive for rash Hematologic/lymphatic: negative Musculoskeletal:negative Neurological: negative Behavioral/Psych: negative Endocrine: negative Allergic/Immunologic: negative    PHYSICAL EXAMINATION: General appearance: alert, cooperative, and no distress Head: Normocephalic, without obvious abnormality, atraumatic Neck: no adenopathy, no JVD, supple, symmetrical, trachea midline, and thyroid not enlarged, symmetric, no tenderness/mass/nodules Lymph nodes: Cervical, supraclavicular, and axillary nodes normal. Resp: clear to auscultation bilaterally Back: symmetric, no curvature. ROM normal. No CVA tenderness. Cardio: regular rate and rhythm, S1, S2 normal, no murmur, click, rub or gallop GI: soft, non-tender; bowel sounds normal; no masses,  no organomegaly Extremities: extremities normal, atraumatic, no cyanosis or edema Neurologic: Alert and oriented X 3, normal strength and tone. Normal symmetric reflexes. Normal coordination and gait  ECOG PERFORMANCE STATUS: 1 - Symptomatic but completely ambulatory  Blood pressure (!) 149/82, pulse 72, temperature 98.6 F (37 C), temperature source Oral, resp. rate 14, weight 167 lb 6.4 oz (75.9 kg), last menstrual period 11/06/2002, SpO2 99 %.  LABORATORY DATA: Lab Results  Component Value Date   WBC 8.0 05/26/2022   HGB 13.0 05/26/2022   HCT 39.5 05/26/2022   MCV 95.2 05/26/2022   PLT 258 05/26/2022      Chemistry      Component Value Date/Time   NA 138 05/26/2022 0748   K 4.3 05/26/2022 0748   CL 104 05/26/2022 0748   CO2 28 05/26/2022 0748   BUN 20 05/26/2022 0748   CREATININE 1.16 (H) 05/26/2022 0748      Component Value Date/Time   CALCIUM 9.7 05/26/2022 0748   ALKPHOS 99 05/26/2022 0748   AST 24 05/26/2022 0748   ALT 32 05/26/2022 0748   BILITOT 0.4 05/26/2022 0748       RADIOGRAPHIC STUDIES: No results found.  ASSESSMENT AND PLAN: This is a very pleasant 67 years old white female with Kidney cancer diagnosed in September 2023.  She was found to have T3a clear-cell renal cell carcinoma without any evidence of metastatic disease.  She is S/P robotic assisted laparoscopic right radical  nephrectomy on November 03, 2021. The final pathology showed clear-cell renal cell carcinoma nuclear grade 2 measuring 3.5 cm with tumor extending into the renal vein indicating T3a disease.  The patient was then treated with adjuvant Pembrolizumab 200 mg every 3 weeks started on December 23, 2021.  She is here for cycle 7 out of planned 17 treatments.  Her treatment was discontinued secondary to flare of psoriasis. She is feeling fine today with no concerning complaints except for the flare of the psoriasis and she is currently followed by dermatology. She had repeat CT scan of the chest, abdomen and pelvis performed at the urology center.  I personally and independently reviewed the scan images and discussed the result with the patient today. Her scan showed no concerning findings for disease recurrence or metastasis but there was some signs of mild pancreatitis on the scan. I will check her serum lipase and will continue to monitor her closely. I will see her back for follow-up visit in 6 months for evaluation with repeat CT scan of the chest, abdomen and pelvis for restaging of her disease. The patient was advised to call immediately if she has any other concerning symptoms  in the interval. The patient voices understanding of current disease status and treatment options and is in agreement with the current care plan.  All questions were answered. The patient knows to call the clinic with any problems, questions or concerns. We can certainly see the patient much sooner if necessary.  The total time spent in the appointment was 30 minutes.  Disclaimer: This note was dictated with voice recognition software. Similar sounding words can inadvertently be transcribed and may not be corrected upon review.

## 2022-05-27 DIAGNOSIS — L400 Psoriasis vulgaris: Secondary | ICD-10-CM | POA: Diagnosis not present

## 2022-06-02 DIAGNOSIS — L400 Psoriasis vulgaris: Secondary | ICD-10-CM | POA: Diagnosis not present

## 2022-06-09 DIAGNOSIS — L400 Psoriasis vulgaris: Secondary | ICD-10-CM | POA: Diagnosis not present

## 2022-06-11 DIAGNOSIS — L400 Psoriasis vulgaris: Secondary | ICD-10-CM | POA: Diagnosis not present

## 2022-06-15 DIAGNOSIS — L400 Psoriasis vulgaris: Secondary | ICD-10-CM | POA: Diagnosis not present

## 2022-06-16 ENCOUNTER — Other Ambulatory Visit: Payer: Medicare Other

## 2022-06-16 ENCOUNTER — Ambulatory Visit: Payer: Medicare Other

## 2022-06-16 ENCOUNTER — Ambulatory Visit: Payer: Medicare Other | Admitting: Internal Medicine

## 2022-06-17 DIAGNOSIS — L400 Psoriasis vulgaris: Secondary | ICD-10-CM | POA: Diagnosis not present

## 2022-07-08 ENCOUNTER — Other Ambulatory Visit: Payer: Medicare Other

## 2022-07-08 ENCOUNTER — Ambulatory Visit: Payer: Medicare Other | Admitting: Internal Medicine

## 2022-07-08 ENCOUNTER — Ambulatory Visit: Payer: Medicare Other

## 2022-07-13 DIAGNOSIS — L400 Psoriasis vulgaris: Secondary | ICD-10-CM | POA: Diagnosis not present

## 2022-07-15 DIAGNOSIS — L400 Psoriasis vulgaris: Secondary | ICD-10-CM | POA: Diagnosis not present

## 2022-07-17 DIAGNOSIS — L400 Psoriasis vulgaris: Secondary | ICD-10-CM | POA: Diagnosis not present

## 2022-07-23 DIAGNOSIS — Z85828 Personal history of other malignant neoplasm of skin: Secondary | ICD-10-CM | POA: Diagnosis not present

## 2022-07-23 DIAGNOSIS — L400 Psoriasis vulgaris: Secondary | ICD-10-CM | POA: Diagnosis not present

## 2022-07-24 DIAGNOSIS — L400 Psoriasis vulgaris: Secondary | ICD-10-CM | POA: Diagnosis not present

## 2022-07-27 DIAGNOSIS — L400 Psoriasis vulgaris: Secondary | ICD-10-CM | POA: Diagnosis not present

## 2022-07-29 ENCOUNTER — Encounter: Payer: Self-pay | Admitting: Internal Medicine

## 2022-07-29 DIAGNOSIS — L400 Psoriasis vulgaris: Secondary | ICD-10-CM | POA: Diagnosis not present

## 2022-07-31 DIAGNOSIS — L400 Psoriasis vulgaris: Secondary | ICD-10-CM | POA: Diagnosis not present

## 2022-08-03 DIAGNOSIS — L400 Psoriasis vulgaris: Secondary | ICD-10-CM | POA: Diagnosis not present

## 2022-08-05 DIAGNOSIS — L400 Psoriasis vulgaris: Secondary | ICD-10-CM | POA: Diagnosis not present

## 2022-08-07 DIAGNOSIS — L400 Psoriasis vulgaris: Secondary | ICD-10-CM | POA: Diagnosis not present

## 2022-08-17 DIAGNOSIS — L400 Psoriasis vulgaris: Secondary | ICD-10-CM | POA: Diagnosis not present

## 2022-08-18 DIAGNOSIS — D49511 Neoplasm of unspecified behavior of right kidney: Secondary | ICD-10-CM | POA: Diagnosis not present

## 2022-08-19 DIAGNOSIS — L400 Psoriasis vulgaris: Secondary | ICD-10-CM | POA: Diagnosis not present

## 2022-08-20 DIAGNOSIS — N201 Calculus of ureter: Secondary | ICD-10-CM | POA: Diagnosis not present

## 2022-08-20 DIAGNOSIS — Z85528 Personal history of other malignant neoplasm of kidney: Secondary | ICD-10-CM | POA: Diagnosis not present

## 2022-08-20 DIAGNOSIS — D49511 Neoplasm of unspecified behavior of right kidney: Secondary | ICD-10-CM | POA: Diagnosis not present

## 2022-08-20 DIAGNOSIS — I700 Atherosclerosis of aorta: Secondary | ICD-10-CM | POA: Diagnosis not present

## 2022-08-20 DIAGNOSIS — N200 Calculus of kidney: Secondary | ICD-10-CM | POA: Diagnosis not present

## 2022-08-21 DIAGNOSIS — L400 Psoriasis vulgaris: Secondary | ICD-10-CM | POA: Diagnosis not present

## 2022-08-24 DIAGNOSIS — L400 Psoriasis vulgaris: Secondary | ICD-10-CM | POA: Diagnosis not present

## 2022-08-25 DIAGNOSIS — R3915 Urgency of urination: Secondary | ICD-10-CM | POA: Diagnosis not present

## 2022-08-25 DIAGNOSIS — N201 Calculus of ureter: Secondary | ICD-10-CM | POA: Diagnosis not present

## 2022-08-25 DIAGNOSIS — D49511 Neoplasm of unspecified behavior of right kidney: Secondary | ICD-10-CM | POA: Diagnosis not present

## 2022-08-26 DIAGNOSIS — L400 Psoriasis vulgaris: Secondary | ICD-10-CM | POA: Diagnosis not present

## 2022-08-28 DIAGNOSIS — L400 Psoriasis vulgaris: Secondary | ICD-10-CM | POA: Diagnosis not present

## 2022-09-07 DIAGNOSIS — L400 Psoriasis vulgaris: Secondary | ICD-10-CM | POA: Diagnosis not present

## 2022-09-09 DIAGNOSIS — L400 Psoriasis vulgaris: Secondary | ICD-10-CM | POA: Diagnosis not present

## 2022-09-16 DIAGNOSIS — L400 Psoriasis vulgaris: Secondary | ICD-10-CM | POA: Diagnosis not present

## 2022-09-22 DIAGNOSIS — L400 Psoriasis vulgaris: Secondary | ICD-10-CM | POA: Diagnosis not present

## 2022-09-22 DIAGNOSIS — Z85828 Personal history of other malignant neoplasm of skin: Secondary | ICD-10-CM | POA: Diagnosis not present

## 2022-09-22 DIAGNOSIS — C44629 Squamous cell carcinoma of skin of left upper limb, including shoulder: Secondary | ICD-10-CM | POA: Diagnosis not present

## 2022-10-01 DIAGNOSIS — K080 Exfoliation of teeth due to systemic causes: Secondary | ICD-10-CM | POA: Diagnosis not present

## 2022-10-08 DIAGNOSIS — N201 Calculus of ureter: Secondary | ICD-10-CM | POA: Diagnosis not present

## 2022-10-08 DIAGNOSIS — D49511 Neoplasm of unspecified behavior of right kidney: Secondary | ICD-10-CM | POA: Diagnosis not present

## 2022-10-26 ENCOUNTER — Telehealth: Payer: Self-pay | Admitting: *Deleted

## 2022-10-26 NOTE — Telephone Encounter (Signed)
LM to call Dr Asa Lente office regarding scan

## 2022-10-28 ENCOUNTER — Telehealth: Payer: Self-pay | Admitting: Medical Oncology

## 2022-10-28 NOTE — Telephone Encounter (Signed)
Dr. Cardell Peach , urology ordered CT a/p w contrast to evaluate kidney stones. . She will call his office to coordinate his orders with Mohamed's orders and labs expected 10/14.She will call back.

## 2022-11-13 DIAGNOSIS — K080 Exfoliation of teeth due to systemic causes: Secondary | ICD-10-CM | POA: Diagnosis not present

## 2022-11-16 DIAGNOSIS — D49511 Neoplasm of unspecified behavior of right kidney: Secondary | ICD-10-CM | POA: Diagnosis not present

## 2022-11-23 ENCOUNTER — Other Ambulatory Visit: Payer: Medicare Other

## 2022-11-23 ENCOUNTER — Ambulatory Visit (HOSPITAL_COMMUNITY)
Admission: RE | Admit: 2022-11-23 | Discharge: 2022-11-23 | Disposition: A | Discharge status: Home / Self Care | Payer: Medicare Other | Source: Ambulatory Visit | Attending: Internal Medicine | Admitting: Internal Medicine

## 2022-11-23 ENCOUNTER — Inpatient Hospital Stay: Payer: Medicare Other | Attending: Internal Medicine

## 2022-11-23 DIAGNOSIS — I700 Atherosclerosis of aorta: Secondary | ICD-10-CM | POA: Insufficient documentation

## 2022-11-23 DIAGNOSIS — M51369 Other intervertebral disc degeneration, lumbar region without mention of lumbar back pain or lower extremity pain: Secondary | ICD-10-CM | POA: Diagnosis not present

## 2022-11-23 DIAGNOSIS — Z85528 Personal history of other malignant neoplasm of kidney: Secondary | ICD-10-CM | POA: Insufficient documentation

## 2022-11-23 DIAGNOSIS — C641 Malignant neoplasm of right kidney, except renal pelvis: Secondary | ICD-10-CM | POA: Insufficient documentation

## 2022-11-23 DIAGNOSIS — C649 Malignant neoplasm of unspecified kidney, except renal pelvis: Secondary | ICD-10-CM | POA: Insufficient documentation

## 2022-11-23 DIAGNOSIS — L409 Psoriasis, unspecified: Secondary | ICD-10-CM | POA: Insufficient documentation

## 2022-11-23 DIAGNOSIS — Z9221 Personal history of antineoplastic chemotherapy: Secondary | ICD-10-CM | POA: Diagnosis not present

## 2022-11-23 DIAGNOSIS — I2510 Atherosclerotic heart disease of native coronary artery without angina pectoris: Secondary | ICD-10-CM | POA: Insufficient documentation

## 2022-11-23 DIAGNOSIS — Z905 Acquired absence of kidney: Secondary | ICD-10-CM | POA: Diagnosis not present

## 2022-11-23 DIAGNOSIS — J189 Pneumonia, unspecified organism: Secondary | ICD-10-CM | POA: Diagnosis not present

## 2022-11-23 LAB — CMP (CANCER CENTER ONLY)
ALT: 15 U/L (ref 0–44)
AST: 22 U/L (ref 15–41)
Albumin: 4.1 g/dL (ref 3.5–5.0)
Alkaline Phosphatase: 77 U/L (ref 38–126)
Anion gap: 5 (ref 5–15)
BUN: 14 mg/dL (ref 8–23)
CO2: 30 mmol/L (ref 22–32)
Calcium: 9.5 mg/dL (ref 8.9–10.3)
Chloride: 104 mmol/L (ref 98–111)
Creatinine: 1.03 mg/dL — ABNORMAL HIGH (ref 0.44–1.00)
GFR, Estimated: 60 mL/min — ABNORMAL LOW (ref >60)
Glucose, Bld: 100 mg/dL — ABNORMAL HIGH (ref 70–99)
Potassium: 4.4 mmol/L (ref 3.5–5.1)
Sodium: 139 mmol/L (ref 135–145)
Total Bilirubin: 0.5 mg/dL (ref 0.3–1.2)
Total Protein: 7.1 g/dL (ref 6.5–8.1)

## 2022-11-23 LAB — CBC WITH DIFFERENTIAL (CANCER CENTER ONLY)
Abs Immature Granulocytes: 0.02 10*3/uL (ref 0.00–0.07)
Basophils Absolute: 0 10*3/uL (ref 0.0–0.1)
Basophils Relative: 1 %
Eosinophils Absolute: 0.1 10*3/uL (ref 0.0–0.5)
Eosinophils Relative: 2 %
HCT: 42.5 % (ref 36.0–46.0)
Hemoglobin: 14.3 g/dL (ref 12.0–15.0)
Immature Granulocytes: 0 %
Lymphocytes Relative: 25 %
Lymphs Abs: 1.4 10*3/uL (ref 0.7–4.0)
MCH: 32 pg (ref 26.0–34.0)
MCHC: 33.6 g/dL (ref 30.0–36.0)
MCV: 95.1 fL (ref 80.0–100.0)
Monocytes Absolute: 0.5 10*3/uL (ref 0.1–1.0)
Monocytes Relative: 8 %
Neutro Abs: 3.7 10*3/uL (ref 1.7–7.7)
Neutrophils Relative %: 64 %
Platelet Count: 200 10*3/uL (ref 150–400)
RBC: 4.47 MIL/uL (ref 3.87–5.11)
RDW: 13.5 % (ref 11.5–15.5)
WBC Count: 5.8 10*3/uL (ref 4.0–10.5)
nRBC: 0 % (ref 0.0–0.2)

## 2022-11-24 DIAGNOSIS — C44722 Squamous cell carcinoma of skin of right lower limb, including hip: Secondary | ICD-10-CM | POA: Diagnosis not present

## 2022-11-24 DIAGNOSIS — L400 Psoriasis vulgaris: Secondary | ICD-10-CM | POA: Diagnosis not present

## 2022-11-24 DIAGNOSIS — Z85828 Personal history of other malignant neoplasm of skin: Secondary | ICD-10-CM | POA: Diagnosis not present

## 2022-11-25 ENCOUNTER — Inpatient Hospital Stay: Payer: Medicare Other | Admitting: Internal Medicine

## 2022-11-25 VITALS — BP 153/83 | HR 72 | Temp 98.0°F | Resp 18 | Ht 67.0 in | Wt 169.7 lb

## 2022-11-25 DIAGNOSIS — Z85528 Personal history of other malignant neoplasm of kidney: Secondary | ICD-10-CM | POA: Diagnosis not present

## 2022-11-25 DIAGNOSIS — L400 Psoriasis vulgaris: Secondary | ICD-10-CM | POA: Diagnosis not present

## 2022-11-25 DIAGNOSIS — C641 Malignant neoplasm of right kidney, except renal pelvis: Secondary | ICD-10-CM

## 2022-11-25 DIAGNOSIS — Z905 Acquired absence of kidney: Secondary | ICD-10-CM | POA: Diagnosis not present

## 2022-11-25 DIAGNOSIS — L409 Psoriasis, unspecified: Secondary | ICD-10-CM | POA: Diagnosis not present

## 2022-11-25 NOTE — Progress Notes (Signed)
[facility] Telephone:(336) [phone]   Fax:(336) [phone]  OFFICE PROGRESS NOTE  Cassidy Palmer, MD [address] [place] [address]  DIAGNOSIS: Kidney cancer diagnosed in September 2023.  She was found to have T3a clear-cell renal cell carcinoma without any evidence of metastatic disease.     PRIOR THERAPY:  1) S/P robotic assisted laparoscopic right radical nephrectomy on November 03, 2021. The final pathology showed clear-cell renal cell carcinoma nuclear grade 2 measuring 3.5 cm with tumor extending into the renal vein indicating T3a disease.   2) Pembrolizumab 200 mg every 3 weeks started on December 23, 2021.  She is here for cycle 7 out of planned 17 treatments.  Her treatment was discontinued secondary to flare of psoriasis.   CURRENT THERAPY: Observation.  INTERVAL HISTORY: Cassidy Terry 67 y.o. female returns to the clinic today for follow-up visit accompanied by her sister.Discussed the use of AI scribe software for clinical note transcription with the patient, who gave verbal consent to proceed.  History of Present Illness   Cassidy Terry, a 67 year old patient with a history of T3A kidney cancer, underwent a right radical nephrectomy in September 2023. Postoperatively, the patient was started on adjuvant treatment with Keytruda in November 2023. However, after seven cycles of treatment, the therapy was discontinued due to the patient's intolerance, manifested as a severe skin flare of psoriasis.  Since the cessation of Keytruda, the patient reports a significant improvement in the skin condition, although it has not completely resolved. The skin flare-up, initially widespread, is now primarily localized to the legs, with some residual involvement of the scalp. The patient has been under the care of a dermatologist for this issue, using a prescribed cream twice daily and undergoing light box therapy.  The patient's recent scans,  including a chest, abdomen, and pelvis scan, show no evidence of disease recurrence or metastasis. The patient is also under the care of a urologist, Dr. Cardell Peach, who conducts regular three-monthly scans and blood work. The patient's most recent lab work also showed no concerning findings.       MEDICAL HISTORY: Past Medical History:  Diagnosis Date   Arthritis    Depression    Hypertension    Hypothyroidism    Psoriasis    Sleep apnea    Mild without the use of a cpap    ALLERGIES:  is allergic to latex, monascus purpureus went yeast, simvastatin, and sulfa antibiotics.  MEDICATIONS:  Current Outpatient Medications  Medication Sig Dispense Refill   Cetirizine HCl (ZYRTEC ALLERGY PO)      escitalopram (LEXAPRO) 10 MG tablet Take 10 mg by mouth daily.     ezetimibe (ZETIA) 10 MG tablet Take 10 mg by mouth daily.     levothyroxine (SYNTHROID) 25 MCG tablet Take 25 mcg by mouth daily before breakfast.     No current facility-administered medications for this visit.    SURGICAL HISTORY:  Past Surgical History:  Procedure Laterality Date   ABDOMINAL HYSTERECTOMY     TAH FOR HEMATOMETRIA   BELPHAROPTOSIS REPAIR     Bilateral   CARPAL TUNNEL RELEASE Left 02/24/2018   Procedure: LEFT CARPAL TUNNEL RELEASE;  Surgeon: Cindee Salt, MD;  Location: [facility];  Service: Orthopedics;  Laterality: Left;   CESAREAN SECTION     X2   CHOLECYSTECTOMY N/A 07/20/2021   Procedure: LAPAROSCOPIC CHOLECYSTECTOMY;  Surgeon: Gaynelle Adu, MD;  Location: [facility] OR;  Service: General;  Laterality: N/A;  DILATION AND CURETTAGE OF UTERUS     X2   ENDOMETRIAL ABLATION     ENDOMYORESECTION   FOOT SURGERY  10/2010   rt. foot, 2nd toe, 1 pin to stay in   LIPOMA EXCISION Left 02/13/2020   Procedure: EXCISION SUBCUTANEOUS BUTTOCK LIPOMA;  Surgeon: Emelia Loron, MD;  Location: [facility];  Service: General;  Laterality: Left;  GEN AND LMA   ROBOT ASSISTED LAPAROSCOPIC NEPHRECTOMY  Right 11/03/2021   Procedure: XI ROBOTIC ASSISTED LAPAROSCOPIC NEPHRECTOMY;  Surgeon: Jannifer Hick, MD;  Location: [facility];  Service: Urology;  Laterality: Right;   TONSILLECTOMY     TUBAL LIGATION     LTL-FULGURATION/TRANSECTION    REVIEW OF SYSTEMS:  Constitutional: negative Eyes: negative Ears, nose, mouth, throat, and face: negative Respiratory: negative Cardiovascular: negative Gastrointestinal: negative Genitourinary:negative Integument/breast: positive for rash Hematologic/lymphatic: negative Musculoskeletal:negative Neurological: negative Behavioral/Psych: negative Endocrine: negative Allergic/Immunologic: negative   PHYSICAL EXAMINATION: General appearance: alert, cooperative, and no distress Head: Normocephalic, without obvious abnormality, atraumatic Neck: no adenopathy, no JVD, supple, symmetrical, trachea midline, and thyroid not enlarged, symmetric, no tenderness/mass/nodules Lymph nodes: Cervical, supraclavicular, and axillary nodes normal. Resp: clear to auscultation bilaterally Back: symmetric, no curvature. ROM normal. No CVA tenderness. Cardio: regular rate and rhythm, S1, S2 normal, no murmur, click, rub or gallop GI: soft, non-tender; bowel sounds normal; no masses,  no organomegaly Extremities: extremities normal, atraumatic, no cyanosis or edema Neurologic: Alert and oriented X 3, normal strength and tone. Normal symmetric reflexes. Normal coordination and gait  ECOG PERFORMANCE STATUS: 1 - Symptomatic but completely ambulatory  Blood pressure (!) 153/83, pulse 72, temperature 98 F (36.7 C), temperature source Oral, resp. rate 18, height 5\' 7"  (1.702 m), weight 169 lb 11.2 oz (77 kg), last menstrual period 11/06/2002, SpO2 100%.  LABORATORY DATA: Lab Results  Component Value Date   WBC 5.8 11/23/2022   HGB 14.3 11/23/2022   HCT 42.5 11/23/2022   MCV 95.1 11/23/2022   PLT 200 11/23/2022      Chemistry      Component Value Date/Time   NA 139  11/23/2022 1120   K 4.4 11/23/2022 1120   CL 104 11/23/2022 1120   CO2 30 11/23/2022 1120   BUN 14 11/23/2022 1120   CREATININE 1.03 (H) 11/23/2022 1120      Component Value Date/Time   CALCIUM 9.5 11/23/2022 1120   ALKPHOS 77 11/23/2022 1120   AST 22 11/23/2022 1120   ALT 15 11/23/2022 1120   BILITOT 0.5 11/23/2022 1120       RADIOGRAPHIC STUDIES: CT Chest Wo Contrast  Result Date: 11/23/2022 CLINICAL DATA:  Right renal cell cancer, status post chemotherapy EXAM: CT CHEST, ABDOMEN AND PELVIS WITHOUT CONTRAST TECHNIQUE: Multidetector CT imaging of the chest, abdomen and pelvis was performed following the standard protocol without IV contrast. RADIATION DOSE REDUCTION: This exam was performed according to the departmental dose-optimization program which includes automated exposure control, adjustment of the mA and/or kV according to patient size and/or use of iterative reconstruction technique. COMPARISON:  08/20/2022 FINDINGS: CT CHEST FINDINGS Cardiovascular: The heart is normal in size. No pericardial effusion. No evidence thoracic aortic aneurysm. Very mild coronary atherosclerosis of the LAD. Mediastinum/Nodes: No suspicious mediastinal lymphadenopathy. Visualized thyroid is unremarkable. Lungs/Pleura: Biapical pleural-parenchymal scarring. No suspicious pulmonary nodules. Mild peribronchovascular ground-glass opacity in the central left lower lobe (series 6/image 100), with additional subpleural ground-glass opacities in the lingula and bilateral lower lobes, favoring post infectious inflammatory scarring although mild infection/pneumonia is possible.  No pleural effusion or pneumothorax. Musculoskeletal: Visualized osseous structures are within normal limits. CT ABDOMEN PELVIS FINDINGS Hepatobiliary: Unenhanced liver is unremarkable. Status post cholecystectomy. No intrahepatic or extrahepatic duct dilatation. Pancreas: Within normal limits. Spleen: Within normal limits. Adrenals/Urinary  Tract: Adrenal glands are within normal limits. Status post right nephrectomy. Minimal linear/nodular soft tissue in the medial surgical bed (series 2/image 5), unchanged, likely reflecting postsurgical changes. Left kidney is within normal limits. No renal, ureteral, or bladder calculi. No hydronephrosis. Bladder is within normal limits. Stomach/Bowel: Stomach is within normal limits. No evidence of bowel obstruction. Normal appendix (series 2/image 96). No colonic wall thickening or inflammatory changes. Vascular/Lymphatic: No evidence of abdominal aortic aneurysm. Atherosclerotic calcifications of the abdominal aorta and branch vessels. No suspicious abdominopelvic lymphadenopathy. Reproductive: Status post hysterectomy. No adnexal masses. Other: No abdominopelvic ascites. Musculoskeletal: Mild degenerative changes of the lumbar spine. IMPRESSION: Status post right nephrectomy. No findings suspicious for recurrent or metastatic disease. Mild ground-glass opacities in the left lower lobe, favoring post infectious inflammatory scarring, although mild atypical infection/pneumonia (such as COVID) is possible. Electronically Signed   By: Charline Bills M.D.   On: 11/23/2022 15:29   CT Abdomen Pelvis Wo Contrast  Result Date: 11/23/2022 CLINICAL DATA:  Right renal cell cancer, status post chemotherapy EXAM: CT CHEST, ABDOMEN AND PELVIS WITHOUT CONTRAST TECHNIQUE: Multidetector CT imaging of the chest, abdomen and pelvis was performed following the standard protocol without IV contrast. RADIATION DOSE REDUCTION: This exam was performed according to the departmental dose-optimization program which includes automated exposure control, adjustment of the mA and/or kV according to patient size and/or use of iterative reconstruction technique. COMPARISON:  08/20/2022 FINDINGS: CT CHEST FINDINGS Cardiovascular: The heart is normal in size. No pericardial effusion. No evidence thoracic aortic aneurysm. Very mild  coronary atherosclerosis of the LAD. Mediastinum/Nodes: No suspicious mediastinal lymphadenopathy. Visualized thyroid is unremarkable. Lungs/Pleura: Biapical pleural-parenchymal scarring. No suspicious pulmonary nodules. Mild peribronchovascular ground-glass opacity in the central left lower lobe (series 6/image 100), with additional subpleural ground-glass opacities in the lingula and bilateral lower lobes, favoring post infectious inflammatory scarring although mild infection/pneumonia is possible. No pleural effusion or pneumothorax. Musculoskeletal: Visualized osseous structures are within normal limits. CT ABDOMEN PELVIS FINDINGS Hepatobiliary: Unenhanced liver is unremarkable. Status post cholecystectomy. No intrahepatic or extrahepatic duct dilatation. Pancreas: Within normal limits. Spleen: Within normal limits. Adrenals/Urinary Tract: Adrenal glands are within normal limits. Status post right nephrectomy. Minimal linear/nodular soft tissue in the medial surgical bed (series 2/image 5), unchanged, likely reflecting postsurgical changes. Left kidney is within normal limits. No renal, ureteral, or bladder calculi. No hydronephrosis. Bladder is within normal limits. Stomach/Bowel: Stomach is within normal limits. No evidence of bowel obstruction. Normal appendix (series 2/image 96). No colonic wall thickening or inflammatory changes. Vascular/Lymphatic: No evidence of abdominal aortic aneurysm. Atherosclerotic calcifications of the abdominal aorta and branch vessels. No suspicious abdominopelvic lymphadenopathy. Reproductive: Status post hysterectomy. No adnexal masses. Other: No abdominopelvic ascites. Musculoskeletal: Mild degenerative changes of the lumbar spine. IMPRESSION: Status post right nephrectomy. No findings suspicious for recurrent or metastatic disease. Mild ground-glass opacities in the left lower lobe, favoring post infectious inflammatory scarring, although mild atypical infection/pneumonia  (such as COVID) is possible. Electronically Signed   By: Charline Bills M.D.   On: 11/23/2022 15:29    ASSESSMENT AND PLAN: This is a very pleasant 67 years old white female with Kidney cancer diagnosed in September 2023.  She was found to have T3a clear-cell renal cell carcinoma without any evidence of  metastatic disease.  She is S/P robotic assisted laparoscopic right radical nephrectomy on November 03, 2021. The final pathology showed clear-cell renal cell carcinoma nuclear grade 2 measuring 3.5 cm with tumor extending into the renal vein indicating T3a disease.  The patient was then treated with adjuvant Pembrolizumab 200 mg every 3 weeks started on December 23, 2021.  She is here for cycle 7 out of planned 17 treatments.  Her treatment was discontinued secondary to flare of psoriasis. The patient is currently on observation and she is feeling fine.    Kidney Cancer (T3A) Status post right radical nephrectomy in September 2023. Adjuvant treatment with Rande Lawman was initiated in November 2023 but discontinued after seven cycles due to intolerance (skin flare up). Recent imaging shows no evidence of disease recurrence or metastasis. -Continue monitoring with scans and lab work every six months. -Coordinate with urologist Dr. Cardell Peach to avoid redundant imaging and lab work.  Psoriasis Flare Improvement noted in skin condition, with remaining lesions primarily on legs and scalp. Currently under the care of a dermatologist. -Continue current dermatological treatment plan, including topical cream and light box therapy.   Advised to call immediately if she has any other concerning symptoms in the interval. The patient voices understanding of current disease status and treatment options and is in agreement with the current care plan.  All questions were answered. The patient knows to call the clinic with any problems, questions or concerns. We can certainly see the patient much sooner if necessary.  The  total time spent in the appointment was 30 minutes.  Disclaimer: This note was dictated with voice recognition software. Similar sounding words can inadvertently be transcribed and may not be corrected upon review.

## 2022-11-27 DIAGNOSIS — L400 Psoriasis vulgaris: Secondary | ICD-10-CM | POA: Diagnosis not present

## 2022-12-03 DIAGNOSIS — L400 Psoriasis vulgaris: Secondary | ICD-10-CM | POA: Diagnosis not present

## 2022-12-07 DIAGNOSIS — L400 Psoriasis vulgaris: Secondary | ICD-10-CM | POA: Diagnosis not present

## 2022-12-09 DIAGNOSIS — R3915 Urgency of urination: Secondary | ICD-10-CM | POA: Diagnosis not present

## 2022-12-09 DIAGNOSIS — D49511 Neoplasm of unspecified behavior of right kidney: Secondary | ICD-10-CM | POA: Diagnosis not present

## 2022-12-09 DIAGNOSIS — L400 Psoriasis vulgaris: Secondary | ICD-10-CM | POA: Diagnosis not present

## 2022-12-10 DIAGNOSIS — K080 Exfoliation of teeth due to systemic causes: Secondary | ICD-10-CM | POA: Diagnosis not present

## 2022-12-11 DIAGNOSIS — L400 Psoriasis vulgaris: Secondary | ICD-10-CM | POA: Diagnosis not present

## 2022-12-14 DIAGNOSIS — L400 Psoriasis vulgaris: Secondary | ICD-10-CM | POA: Diagnosis not present

## 2022-12-16 DIAGNOSIS — L400 Psoriasis vulgaris: Secondary | ICD-10-CM | POA: Diagnosis not present

## 2022-12-18 DIAGNOSIS — L400 Psoriasis vulgaris: Secondary | ICD-10-CM | POA: Diagnosis not present

## 2022-12-21 DIAGNOSIS — L400 Psoriasis vulgaris: Secondary | ICD-10-CM | POA: Diagnosis not present

## 2022-12-23 DIAGNOSIS — L400 Psoriasis vulgaris: Secondary | ICD-10-CM | POA: Diagnosis not present

## 2022-12-25 DIAGNOSIS — L400 Psoriasis vulgaris: Secondary | ICD-10-CM | POA: Diagnosis not present

## 2022-12-28 DIAGNOSIS — L400 Psoriasis vulgaris: Secondary | ICD-10-CM | POA: Diagnosis not present

## 2022-12-30 DIAGNOSIS — L400 Psoriasis vulgaris: Secondary | ICD-10-CM | POA: Diagnosis not present

## 2023-01-01 DIAGNOSIS — L400 Psoriasis vulgaris: Secondary | ICD-10-CM | POA: Diagnosis not present

## 2023-01-04 DIAGNOSIS — L400 Psoriasis vulgaris: Secondary | ICD-10-CM | POA: Diagnosis not present

## 2023-01-06 DIAGNOSIS — L400 Psoriasis vulgaris: Secondary | ICD-10-CM | POA: Diagnosis not present

## 2023-01-14 DIAGNOSIS — L400 Psoriasis vulgaris: Secondary | ICD-10-CM | POA: Diagnosis not present

## 2023-01-20 DIAGNOSIS — L400 Psoriasis vulgaris: Secondary | ICD-10-CM | POA: Diagnosis not present

## 2023-01-22 DIAGNOSIS — L400 Psoriasis vulgaris: Secondary | ICD-10-CM | POA: Diagnosis not present

## 2023-01-25 DIAGNOSIS — L400 Psoriasis vulgaris: Secondary | ICD-10-CM | POA: Diagnosis not present

## 2023-01-27 DIAGNOSIS — Z85828 Personal history of other malignant neoplasm of skin: Secondary | ICD-10-CM | POA: Diagnosis not present

## 2023-01-27 DIAGNOSIS — L400 Psoriasis vulgaris: Secondary | ICD-10-CM | POA: Diagnosis not present

## 2023-01-29 DIAGNOSIS — L400 Psoriasis vulgaris: Secondary | ICD-10-CM | POA: Diagnosis not present

## 2023-02-01 DIAGNOSIS — L400 Psoriasis vulgaris: Secondary | ICD-10-CM | POA: Diagnosis not present

## 2023-02-04 DIAGNOSIS — L400 Psoriasis vulgaris: Secondary | ICD-10-CM | POA: Diagnosis not present

## 2023-02-18 DIAGNOSIS — D49511 Neoplasm of unspecified behavior of right kidney: Secondary | ICD-10-CM | POA: Diagnosis not present

## 2023-02-24 DIAGNOSIS — D2261 Melanocytic nevi of right upper limb, including shoulder: Secondary | ICD-10-CM | POA: Diagnosis not present

## 2023-02-24 DIAGNOSIS — D225 Melanocytic nevi of trunk: Secondary | ICD-10-CM | POA: Diagnosis not present

## 2023-02-24 DIAGNOSIS — Z85828 Personal history of other malignant neoplasm of skin: Secondary | ICD-10-CM | POA: Diagnosis not present

## 2023-02-24 DIAGNOSIS — L821 Other seborrheic keratosis: Secondary | ICD-10-CM | POA: Diagnosis not present

## 2023-02-26 DIAGNOSIS — R911 Solitary pulmonary nodule: Secondary | ICD-10-CM | POA: Diagnosis not present

## 2023-02-26 DIAGNOSIS — C641 Malignant neoplasm of right kidney, except renal pelvis: Secondary | ICD-10-CM | POA: Diagnosis not present

## 2023-02-26 DIAGNOSIS — Z905 Acquired absence of kidney: Secondary | ICD-10-CM | POA: Diagnosis not present

## 2023-02-26 DIAGNOSIS — D49511 Neoplasm of unspecified behavior of right kidney: Secondary | ICD-10-CM | POA: Diagnosis not present

## 2023-02-26 DIAGNOSIS — Z9049 Acquired absence of other specified parts of digestive tract: Secondary | ICD-10-CM | POA: Diagnosis not present

## 2023-03-16 DIAGNOSIS — R3915 Urgency of urination: Secondary | ICD-10-CM | POA: Diagnosis not present

## 2023-03-16 DIAGNOSIS — D49511 Neoplasm of unspecified behavior of right kidney: Secondary | ICD-10-CM | POA: Diagnosis not present

## 2023-03-16 DIAGNOSIS — K080 Exfoliation of teeth due to systemic causes: Secondary | ICD-10-CM | POA: Diagnosis not present

## 2023-03-29 DIAGNOSIS — H47323 Drusen of optic disc, bilateral: Secondary | ICD-10-CM | POA: Diagnosis not present

## 2023-03-29 DIAGNOSIS — H02834 Dermatochalasis of left upper eyelid: Secondary | ICD-10-CM | POA: Diagnosis not present

## 2023-03-29 DIAGNOSIS — H04123 Dry eye syndrome of bilateral lacrimal glands: Secondary | ICD-10-CM | POA: Diagnosis not present

## 2023-03-29 DIAGNOSIS — H02831 Dermatochalasis of right upper eyelid: Secondary | ICD-10-CM | POA: Diagnosis not present

## 2023-04-12 ENCOUNTER — Encounter (HOSPITAL_BASED_OUTPATIENT_CLINIC_OR_DEPARTMENT_OTHER): Payer: Self-pay

## 2023-04-12 DIAGNOSIS — Z79899 Other long term (current) drug therapy: Secondary | ICD-10-CM | POA: Diagnosis not present

## 2023-04-12 DIAGNOSIS — Z85528 Personal history of other malignant neoplasm of kidney: Secondary | ICD-10-CM | POA: Diagnosis not present

## 2023-04-12 DIAGNOSIS — R519 Headache, unspecified: Secondary | ICD-10-CM | POA: Diagnosis not present

## 2023-04-12 DIAGNOSIS — E039 Hypothyroidism, unspecified: Secondary | ICD-10-CM | POA: Diagnosis not present

## 2023-04-12 DIAGNOSIS — I10 Essential (primary) hypertension: Secondary | ICD-10-CM | POA: Insufficient documentation

## 2023-04-12 DIAGNOSIS — Z91040 Latex allergy status: Secondary | ICD-10-CM | POA: Diagnosis not present

## 2023-04-12 NOTE — ED Notes (Signed)
 Blu, Lt grn, lav in lab

## 2023-04-12 NOTE — ED Triage Notes (Signed)
 Pt c/o HTN, states that she's "been overly stressed so I've been keeping an eye on it."  States she's been taking her BP for the last hour, "won't go down."  Denies CP, HA, NV, blurred vision, states she just "felt off."

## 2023-04-13 ENCOUNTER — Emergency Department (HOSPITAL_BASED_OUTPATIENT_CLINIC_OR_DEPARTMENT_OTHER)

## 2023-04-13 ENCOUNTER — Emergency Department (HOSPITAL_BASED_OUTPATIENT_CLINIC_OR_DEPARTMENT_OTHER)
Admission: EM | Admit: 2023-04-13 | Discharge: 2023-04-13 | Disposition: A | Discharge status: Home / Self Care | Attending: Emergency Medicine | Admitting: Emergency Medicine

## 2023-04-13 DIAGNOSIS — R030 Elevated blood-pressure reading, without diagnosis of hypertension: Secondary | ICD-10-CM

## 2023-04-13 DIAGNOSIS — R519 Headache, unspecified: Secondary | ICD-10-CM | POA: Diagnosis not present

## 2023-04-13 LAB — CBC WITH DIFFERENTIAL/PLATELET
Abs Immature Granulocytes: 0.02 10*3/uL (ref 0.00–0.07)
Basophils Absolute: 0 10*3/uL (ref 0.0–0.1)
Basophils Relative: 1 %
Eosinophils Absolute: 0.2 10*3/uL (ref 0.0–0.5)
Eosinophils Relative: 3 %
HCT: 42.2 % (ref 36.0–46.0)
Hemoglobin: 14 g/dL (ref 12.0–15.0)
Immature Granulocytes: 0 %
Lymphocytes Relative: 32 %
Lymphs Abs: 2.2 10*3/uL (ref 0.7–4.0)
MCH: 32.3 pg (ref 26.0–34.0)
MCHC: 33.2 g/dL (ref 30.0–36.0)
MCV: 97.2 fL (ref 80.0–100.0)
Monocytes Absolute: 0.7 10*3/uL (ref 0.1–1.0)
Monocytes Relative: 10 %
Neutro Abs: 3.7 10*3/uL (ref 1.7–7.7)
Neutrophils Relative %: 54 %
Platelets: 215 10*3/uL (ref 150–400)
RBC: 4.34 MIL/uL (ref 3.87–5.11)
RDW: 12.6 % (ref 11.5–15.5)
WBC: 6.8 10*3/uL (ref 4.0–10.5)
nRBC: 0 % (ref 0.0–0.2)

## 2023-04-13 LAB — BASIC METABOLIC PANEL
Anion gap: 8 (ref 5–15)
BUN: 16 mg/dL (ref 8–23)
CO2: 29 mmol/L (ref 22–32)
Calcium: 9.7 mg/dL (ref 8.9–10.3)
Chloride: 102 mmol/L (ref 98–111)
Creatinine, Ser: 1.1 mg/dL — ABNORMAL HIGH (ref 0.44–1.00)
GFR, Estimated: 55 mL/min — ABNORMAL LOW (ref >60)
Glucose, Bld: 89 mg/dL (ref 70–99)
Potassium: 3.8 mmol/L (ref 3.5–5.1)
Sodium: 139 mmol/L (ref 135–145)

## 2023-04-13 LAB — TROPONIN I (HIGH SENSITIVITY)
Troponin I (High Sensitivity): 6 ng/L (ref <18)
Troponin I (High Sensitivity): 6 ng/L (ref <18)

## 2023-04-13 MED ORDER — AMLODIPINE BESYLATE 5 MG PO TABS
5.0000 mg | ORAL_TABLET | Freq: Every day | ORAL | 0 refills | Status: AC
Start: 2023-04-13 — End: ?

## 2023-04-13 NOTE — Discharge Instructions (Signed)
 Keep a Record of your blood pressure and follow-up with your doctor for further medication adjustments.  Return to the ED with headache, chest pain, shortness of breath, any other concerns

## 2023-04-13 NOTE — ED Provider Notes (Signed)
 [place] EMERGENCY DEPARTMENT AT [facility] Provider Note   CSN: [id number] Arrival date & time: 04/12/23  2053     History  Chief Complaint  Patient presents with   Hypertension    Cassidy Terry is a 68 y.o. female.  Patient with a history of psoriasis, hypertension, hypothyroidism, previous kidney cancer here with elevated blood pressure.  States she has got a lot of stress at home since her husband passed away in 11/05/2023.  Blood pressure normally runs in the 1 30-1 40 range and she does not take blood pressure medication.  Over the past several weeks the blood pressure has been higher than usual but today she became concerned because it was 190/115.  She felt well at the time and denies any headache, visual changes, chest pain, shortness of breath, dizziness or lightheadedness.  She continues to feel well.  She was mainly concerned about the elevated blood pressure number.  Denies any focal weakness, numbness or tingling.  No headache or visual changes.  No chest pain or shortness of breath Blood pressure has improved to 149/85 while she has been waiting.  The history is provided by the patient.  Hypertension Pertinent negatives include no chest pain, no abdominal pain, no headaches and no shortness of breath.       Home Medications Prior to Admission medications   Medication Sig Start Date End Date Taking? Authorizing Provider  Cetirizine HCl (ZYRTEC ALLERGY PO)  12/19/21   [provider]  escitalopram (LEXAPRO) 10 MG tablet Take 10 mg by mouth daily. 11/10/13   [provider]  ezetimibe (ZETIA) 10 MG tablet Take 10 mg by mouth daily.    [provider]  levothyroxine (SYNTHROID) 25 MCG tablet Take 25 mcg by mouth daily before breakfast.    [provider]      Allergies    Latex, Monascus purpureus went yeast, Simvastatin, and Sulfa antibiotics    Review of Systems   Review of Systems  Constitutional:  Negative for  activity change, appetite change and fever.  HENT:  Negative for congestion and rhinorrhea.   Eyes:  Negative for visual disturbance.  Respiratory:  Negative for cough, chest tightness and shortness of breath.   Cardiovascular:  Negative for chest pain.  Gastrointestinal:  Negative for abdominal pain, nausea and vomiting.  Genitourinary:  Negative for dysuria and hematuria.  Musculoskeletal:  Negative for arthralgias and myalgias.  Skin:  Negative for rash.  Neurological:  Negative for dizziness, weakness and headaches.   all other systems are negative except as noted in the HPI and PMH.    Physical Exam Updated Vital Signs BP (!) 149/85   Pulse 69   Temp (!) 96.4 F (35.8 C)   Resp 15   LMP 11/06/2002   SpO2 100%  Physical Exam Vitals and nursing note reviewed.  Constitutional:      General: She is not in acute distress.    Appearance: She is well-developed.  HENT:     Head: Normocephalic and atraumatic.     Mouth/Throat:     Pharynx: No oropharyngeal exudate.  Eyes:     Conjunctiva/sclera: Conjunctivae normal.     Pupils: Pupils are equal, round, and reactive to light.  Neck:     Comments: No meningismus. Cardiovascular:     Rate and Rhythm: Normal rate and regular rhythm.     Heart sounds: Normal heart sounds. No murmur heard. Pulmonary:     Effort: Pulmonary effort is normal. No respiratory  distress.     Breath sounds: Normal breath sounds.  Abdominal:     Palpations: Abdomen is soft.     Tenderness: There is no abdominal tenderness. There is no guarding or rebound.  Musculoskeletal:        General: No tenderness. Normal range of motion.     Cervical back: Normal range of motion and neck supple.  Skin:    General: Skin is warm.  Neurological:     Mental Status: She is alert and oriented to person, place, and time.     Cranial Nerves: No cranial nerve deficit.     Motor: No abnormal muscle tone.     Coordination: Coordination normal.     Comments:  5/5  strength throughout. CN 2-12 intact.Equal grip strength.   Psychiatric:        Behavior: Behavior normal.     ED Results / Procedures / Treatments   Labs (all labs ordered are listed, but only abnormal results are displayed) Labs Reviewed  BASIC METABOLIC PANEL - Abnormal; Notable for the following components:      Result Value   Creatinine, Ser 1.10 (*)    GFR, Estimated 55 (*)    All other components within normal limits  CBC WITH DIFFERENTIAL/PLATELET  TROPONIN I (HIGH SENSITIVITY)  TROPONIN I (HIGH SENSITIVITY)    EKG EKG Interpretation Date/Time:  Monday April 12 2023 21:13:44 EST Ventricular Rate:  67 PR Interval:  160 QRS Duration:  76 QT Interval:  420 QTC Calculation: 443 R Axis:   -2  Text Interpretation: Normal sinus rhythm Anterior infarct , age undetermined Abnormal ECG When compared with ECG of 19-Jul-2021 15:31, PREVIOUS ECG IS PRESENT No significant change was found Confirmed by Glynn Octave [phone]) on 04/13/2023 3:05:31 AM  Radiology CT Head Wo Contrast Result Date: 04/13/2023 CLINICAL DATA:  Headache, new onset (Age >= 51y) EXAM: CT HEAD WITHOUT CONTRAST TECHNIQUE: Contiguous axial images were obtained from the base of the skull through the vertex without intravenous contrast. RADIATION DOSE REDUCTION: This exam was performed according to the departmental dose-optimization program which includes automated exposure control, adjustment of the mA and/or kV according to patient size and/or use of iterative reconstruction technique. COMPARISON:  None Available. FINDINGS: Brain: No evidence of acute infarction, hemorrhage, hydrocephalus, extra-axial collection or mass lesion/mass effect. Vascular: No hyperdense vessel. Skull: No acute fracture. Sinuses/Orbits: Clear sinuses.  No acute orbital findings. Other: No mastoid effusions. IMPRESSION: No evidence of acute intracranial abnormality. Electronically Signed   By: Feliberto Harts M.D.   On: 04/13/2023 03:40     Procedures Procedures    Medications Ordered in ED Medications - No data to display  ED Course/ Medical Decision Making/ A&P                                 Medical Decision Making Amount and/or Complexity of Data Reviewed Labs: ordered. Decision-making details documented in ED Course. Radiology: ordered and independent interpretation performed. Decision-making details documented in ED Course. ECG/medicine tests: ordered and independent interpretation performed. Decision-making details documented in ED Course.   Asymptomatic hypertension under a lot of stress at home.  Does not take blood pressure medications.  Neurological exam is nonfocal.  No chest pain or shortness of breath.  Blood has improved on its own.  Will check labs to evaluate for endorgan damage  Electrolytes are reassuring.  Troponin negative.  EKG is nonischemic.  Denies chest pain, shortness  of breath, dizziness, lightheadedness.  Blood sugar has improved to 152/89.  Denies headache, lightheadedness, dizziness, chest pain or shortness of breath.  Low suspicion for hypertensive urgency or emergency.  Risks and benefits of starting low-dose amlodipine discussed with patient.  She agrees to starti it at this time.  She will follow-up with her primary doctor for further blood pressure medication adjustments. Low suspicion for ACS, PE, meningitis, temporal arteritis, intracranial hemorrhage.  She is asymptomatic at this time.  Follow-up with her PCP.  Return precautions discussed.        Final Clinical Impression(s) / ED Diagnoses Final diagnoses:  None    Rx / DC Orders ED Discharge Orders     None         Mikenzie Mccannon, Jeannett Senior, MD 04/13/23 [phone]

## 2023-04-19 DIAGNOSIS — R29890 Loss of height: Secondary | ICD-10-CM | POA: Diagnosis not present

## 2023-04-19 DIAGNOSIS — M8588 Other specified disorders of bone density and structure, other site: Secondary | ICD-10-CM | POA: Diagnosis not present

## 2023-04-19 DIAGNOSIS — Z1231 Encounter for screening mammogram for malignant neoplasm of breast: Secondary | ICD-10-CM | POA: Diagnosis not present

## 2023-04-19 DIAGNOSIS — Z8262 Family history of osteoporosis: Secondary | ICD-10-CM | POA: Diagnosis not present

## 2023-04-20 DIAGNOSIS — K080 Exfoliation of teeth due to systemic causes: Secondary | ICD-10-CM | POA: Diagnosis not present

## 2023-04-26 DIAGNOSIS — Z09 Encounter for follow-up examination after completed treatment for conditions other than malignant neoplasm: Secondary | ICD-10-CM | POA: Diagnosis not present

## 2023-04-26 DIAGNOSIS — Z860100 Personal history of colon polyps, unspecified: Secondary | ICD-10-CM | POA: Diagnosis not present

## 2023-04-26 DIAGNOSIS — K635 Polyp of colon: Secondary | ICD-10-CM | POA: Diagnosis not present

## 2023-04-26 DIAGNOSIS — D175 Benign lipomatous neoplasm of intra-abdominal organs: Secondary | ICD-10-CM | POA: Diagnosis not present

## 2023-04-28 DIAGNOSIS — K635 Polyp of colon: Secondary | ICD-10-CM | POA: Diagnosis not present

## 2023-05-19 DIAGNOSIS — Z905 Acquired absence of kidney: Secondary | ICD-10-CM | POA: Diagnosis not present

## 2023-05-19 DIAGNOSIS — Z0000 Encounter for general adult medical examination without abnormal findings: Secondary | ICD-10-CM | POA: Diagnosis not present

## 2023-05-19 DIAGNOSIS — E039 Hypothyroidism, unspecified: Secondary | ICD-10-CM | POA: Diagnosis not present

## 2023-05-19 DIAGNOSIS — E785 Hyperlipidemia, unspecified: Secondary | ICD-10-CM | POA: Diagnosis not present

## 2023-05-19 DIAGNOSIS — E559 Vitamin D deficiency, unspecified: Secondary | ICD-10-CM | POA: Diagnosis not present

## 2023-05-24 ENCOUNTER — Telehealth: Payer: Self-pay | Admitting: Internal Medicine

## 2023-05-24 NOTE — Telephone Encounter (Signed)
 The patient called to reschedule her appointment and is aware of the changes made.

## 2023-05-25 ENCOUNTER — Inpatient Hospital Stay: Payer: Medicare Other | Admitting: Internal Medicine

## 2023-06-09 ENCOUNTER — Inpatient Hospital Stay: Attending: Internal Medicine | Admitting: Internal Medicine

## 2023-06-09 VITALS — BP 143/82 | HR 71 | Temp 97.2°F | Resp 16 | Ht 67.0 in | Wt 172.4 lb

## 2023-06-09 DIAGNOSIS — Z79899 Other long term (current) drug therapy: Secondary | ICD-10-CM | POA: Insufficient documentation

## 2023-06-09 DIAGNOSIS — C641 Malignant neoplasm of right kidney, except renal pelvis: Secondary | ICD-10-CM

## 2023-06-09 DIAGNOSIS — Z905 Acquired absence of kidney: Secondary | ICD-10-CM | POA: Diagnosis not present

## 2023-06-09 DIAGNOSIS — Z85528 Personal history of other malignant neoplasm of kidney: Secondary | ICD-10-CM | POA: Diagnosis not present

## 2023-06-09 DIAGNOSIS — Z85828 Personal history of other malignant neoplasm of skin: Secondary | ICD-10-CM | POA: Diagnosis not present

## 2023-06-09 NOTE — Progress Notes (Signed)
 [facility] [phone]   [phone]  OFFICE PROGRESS NOTE  Olin Bertin, MD [address] [place] [address]  DIAGNOSIS: Right Renal cell Carcinoma diagnosed in September 2023.  She was found to have T3a clear-cell renal cell carcinoma without any evidence of metastatic disease.     PRIOR THERAPY:  1) S/P robotic assisted laparoscopic right radical nephrectomy on November 03, 2021. The final pathology showed clear-cell renal cell carcinoma nuclear grade 2 measuring 3.5 cm with tumor extending into the renal vein indicating T3a disease.   2) Pembrolizumab  200 mg every 3 weeks started on December 23, 2021.  She is here for cycle 7 out of planned 17 treatments.  Her treatment was discontinued secondary to flare of psoriasis.   CURRENT THERAPY: Observation.  INTERVAL HISTORY: Cassidy Terry 68 y.o. female returns to the clinic today for follow-up visit.Discussed the use of AI scribe software for clinical note transcription with the patient, who gave verbal consent to proceed.  History of Present Illness   Cassidy Terry is a 68 year old female with clear cell renal cell carcinoma who presents for follow-up after treatment with Keytruda .  Diagnosed with T3A clear cell renal cell carcinoma in September 2023, she underwent a right radical nephrectomy and has since completed seven cycles of Keytruda , administered every three weeks. She reports doing well since her last visit in October 2024.  A scan in January 2025 at the urology center showed no new issues. She is awaiting scheduling for her next scan, expected soon as part of her routine follow-up every six months.  No new chest pain, breathing issues, nausea, vomiting, or diarrhea. Her psoriasis has improved and is well-controlled since stopping immune therapy.  Visited her primary care physician two weeks ago for blood work, which was normal. She typically has blood work done  one week before her scans and follows up with her urologist the week after the scan.       MEDICAL HISTORY: Past Medical History:  Diagnosis Date   Arthritis    Depression    Hypertension    Hypothyroidism    Psoriasis    Sleep apnea    Mild without the use of a cpap    ALLERGIES:  is allergic to latex, monascus purpureus went yeast, simvastatin, and sulfa antibiotics.  MEDICATIONS:  Current Outpatient Medications  Medication Sig Dispense Refill   amLODipine  (NORVASC ) 5 MG tablet Take 1 tablet (5 mg total) by mouth daily. 30 tablet 0   Cetirizine HCl (ZYRTEC ALLERGY PO)      escitalopram  (LEXAPRO ) 10 MG tablet Take 10 mg by mouth daily.     ezetimibe  (ZETIA ) 10 MG tablet Take 10 mg by mouth daily.     levothyroxine  (SYNTHROID ) 25 MCG tablet Take 25 mcg by mouth daily before breakfast.     No current facility-administered medications for this visit.    SURGICAL HISTORY:  Past Surgical History:  Procedure Laterality Date   ABDOMINAL HYSTERECTOMY     TAH FOR HEMATOMETRIA   BELPHAROPTOSIS REPAIR     Bilateral   CARPAL TUNNEL RELEASE Left 02/24/2018   Procedure: LEFT CARPAL TUNNEL RELEASE;  Surgeon: Lyanne Sample, MD;  Location: [facility];  Service: Orthopedics;  Laterality: Left;   CESAREAN SECTION     X2   CHOLECYSTECTOMY N/A 07/20/2021   Procedure: LAPAROSCOPIC CHOLECYSTECTOMY;  Surgeon: Aldean Hummingbird, MD;  Location: [facility];  Service: General;  Laterality:  N/A;   DILATION AND CURETTAGE OF UTERUS     X2   ENDOMETRIAL ABLATION     ENDOMYORESECTION   FOOT SURGERY  10/2010   rt. foot, 2nd toe, 1 pin to stay in   LIPOMA EXCISION Left 02/13/2020   Procedure: EXCISION SUBCUTANEOUS BUTTOCK LIPOMA;  Surgeon: Enid Harry, MD;  Location: [facility];  Service: General;  Laterality: Left;  GEN AND LMA   ROBOT ASSISTED LAPAROSCOPIC NEPHRECTOMY Right 11/03/2021   Procedure: XI ROBOTIC ASSISTED LAPAROSCOPIC NEPHRECTOMY;  Surgeon: Lahoma Pigg, MD;   Location: [facility];  Service: Urology;  Laterality: Right;   TONSILLECTOMY     TUBAL LIGATION     LTL-FULGURATION/TRANSECTION    REVIEW OF SYSTEMS:  A comprehensive review of systems was negative.   PHYSICAL EXAMINATION: General appearance: alert, cooperative, and no distress Head: Normocephalic, without obvious abnormality, atraumatic Neck: no adenopathy, no JVD, supple, symmetrical, trachea midline, and thyroid  not enlarged, symmetric, no tenderness/mass/nodules Lymph nodes: Cervical, supraclavicular, and axillary nodes normal. Resp: clear to auscultation bilaterally Back: symmetric, no curvature. ROM normal. No CVA tenderness. Cardio: regular rate and rhythm, S1, S2 normal, no murmur, click, rub or gallop GI: soft, non-tender; bowel sounds normal; no masses,  no organomegaly Extremities: extremities normal, atraumatic, no cyanosis or edema  ECOG PERFORMANCE STATUS: 1 - Symptomatic but completely ambulatory  Blood pressure (!) 143/82, pulse 71, temperature (!) 97.2 F (36.2 C), temperature source Temporal, resp. rate 16, height 5\' 7"  (1.702 m), weight 172 lb 6.4 oz (78.2 kg), last menstrual period 11/06/2002, SpO2 99%.  LABORATORY DATA: Lab Results  Component Value Date   WBC 6.8 04/12/2023   HGB 14.0 04/12/2023   HCT 42.2 04/12/2023   MCV 97.2 04/12/2023   PLT 215 04/12/2023      Chemistry      Component Value Date/Time   NA 139 04/12/2023 2123   K 3.8 04/12/2023 2123   CL 102 04/12/2023 2123   CO2 29 04/12/2023 2123   BUN 16 04/12/2023 2123   CREATININE 1.10 (H) 04/12/2023 2123   CREATININE 1.03 (H) 11/23/2022 1120      Component Value Date/Time   CALCIUM 9.7 04/12/2023 2123   ALKPHOS 77 11/23/2022 1120   AST 22 11/23/2022 1120   ALT 15 11/23/2022 1120   BILITOT 0.5 11/23/2022 1120       RADIOGRAPHIC STUDIES: No results found.  ASSESSMENT AND PLAN: This is a very pleasant 68 years old white female with Kidney cancer diagnosed in September 2023.  She was  found to have T3a clear-cell renal cell carcinoma without any evidence of metastatic disease.  She is S/P robotic assisted laparoscopic right radical nephrectomy on November 03, 2021. The final pathology showed clear-cell renal cell carcinoma nuclear grade 2 measuring 3.5 cm with tumor extending into the renal vein indicating T3a disease.  The patient was then treated with adjuvant Pembrolizumab  200 mg every 3 weeks started on December 23, 2021.  She is here for cycle 7 out of planned 17 treatments.  Her treatment was discontinued secondary to flare of psoriasis. She is currently on observation and feeling fine.    T3A clear cell renal cell carcinoma 69 year old female, status post right radical nephrectomy, completed seven cycles of Keytruda  with no evidence of disease on recent January scan. Reports no new symptoms such as chest pain, dyspnea, nausea, vomiting, or diarrhea. - Coordinate with urologist for next scan within six months. - Schedule follow-up appointment in six months with blood work.  Psoriasis Psoriasis previously exacerbated by immune therapy has resolved since discontinuation of treatment and is currently well-controlled with no active lesions.   The patient was advised to call immediately if she has any concerning symptoms in the interval. The patient voices understanding of current disease status and treatment options and is in agreement with the current care plan.  All questions were answered. The patient knows to call the clinic with any problems, questions or concerns. We can certainly see the patient much sooner if necessary.  The total time spent in the appointment was 30 minutes.  Disclaimer: This note was dictated with voice recognition software. Similar sounding words can inadvertently be transcribed and may not be corrected upon review.

## 2023-07-07 IMAGING — CT CT CARDIAC CORONARY ARTERY CALCIUM SCORE
3 series · 14 of 20 positions shown, 16 images · non-contrast
Comparison: None Available.
COMPARISON: None Available.

Addendum:
CLINICAL DATA: This over-read does not include interpretation of
cardiac or coronary anatomy or pathology. The coronary calcium score
interpretation by the cardiologist is attached.
CLINICAL DATA: Cardiovascular Disease Risk stratification

EXAM:
Coronary Calcium Score
TECHNIQUE: A gated, non-contrast computed tomography scan of the heart was
performed using 3mm slice thickness. Axial images were analyzed on a
dedicated workstation. Calcium scoring of the coronary arteries was
performed using the Agatston method.

[Series 2: ax lung · axial · 0.82mm/px · z∈[+70,+160]mm · 5 of 69 slices shown]
[im 12/69  lung]
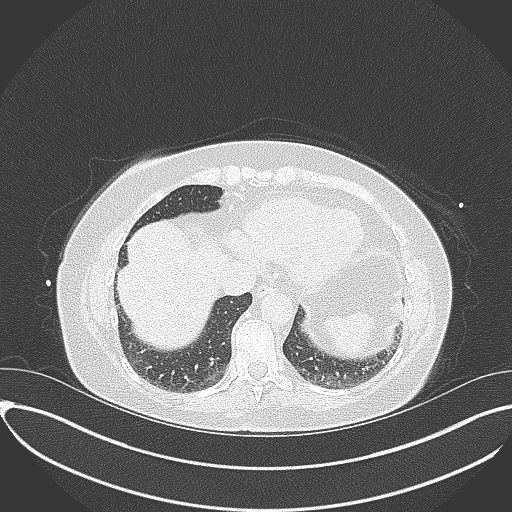
[im 23/69  lung]
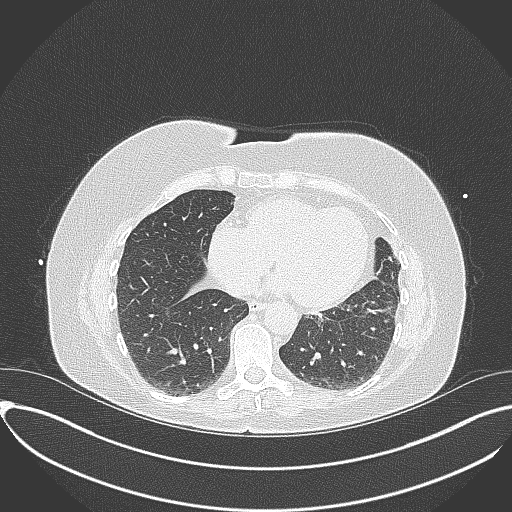
[im 35/69  lung]
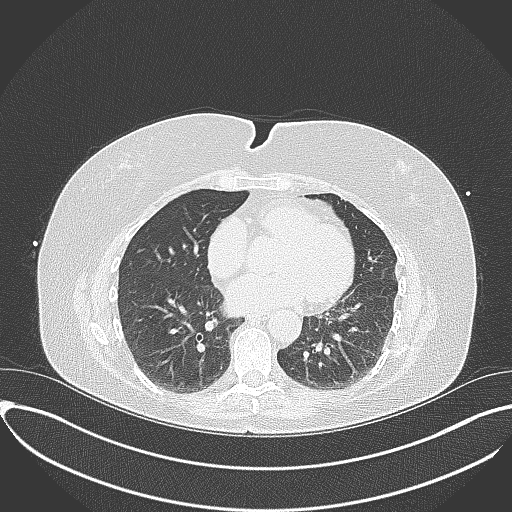
[im 46/69  lung]
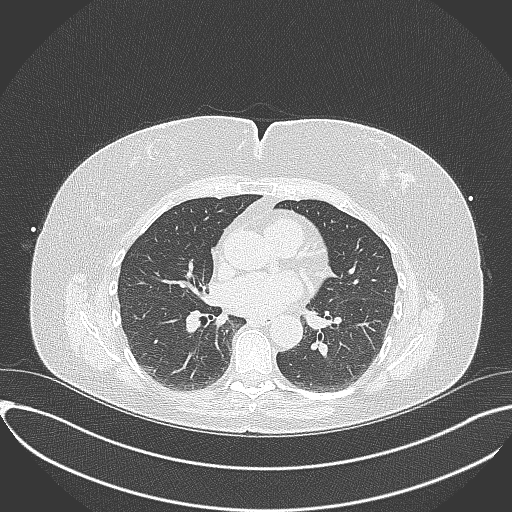
[im 57/69  lung]
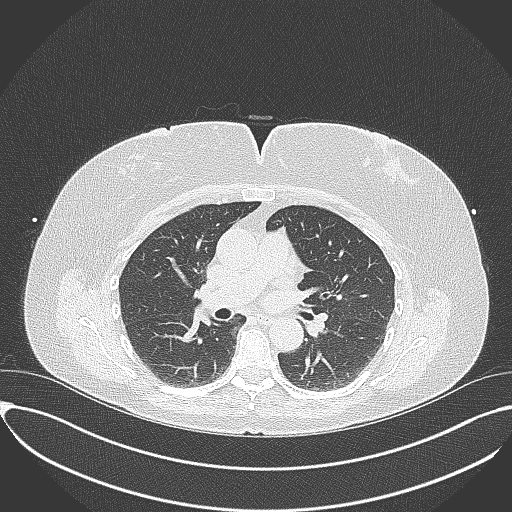

[Series 3: cascseq 3.0 sa36 70% (id) · axial · 0.39mm/px · z∈[+82,+148]mm · 3 of 46 slices shown]
[im 12/46  vessel]
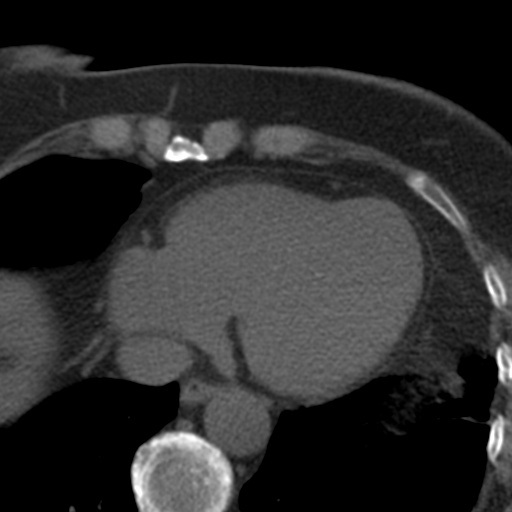
[im 23/46  vessel]
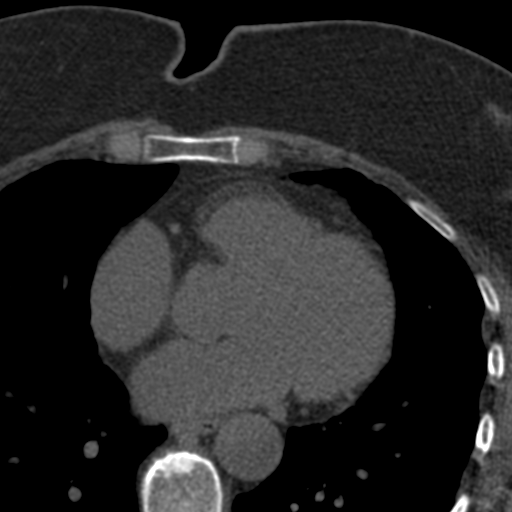
[im 34/46  vessel]
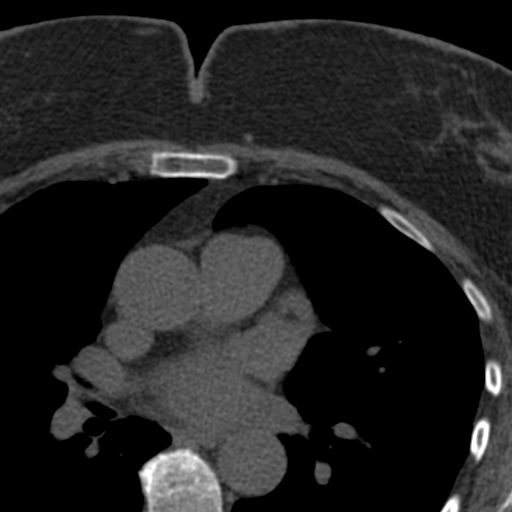

[Series 4: ax st · axial · 0.82mm/px · z∈[+66,+164]mm · 6 of 69 slices shown, 8 images]
[im 10/69  vessel]
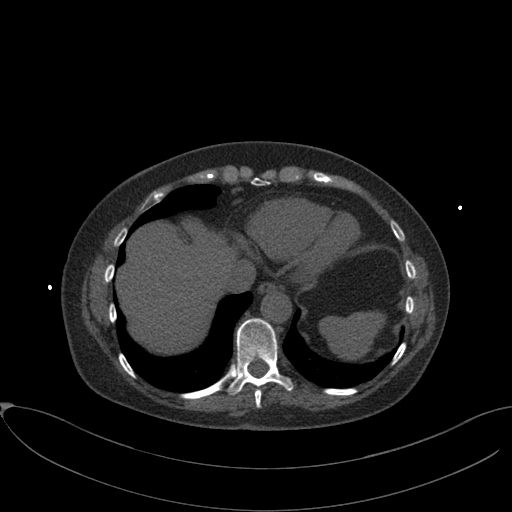
[im 10/69  lung]
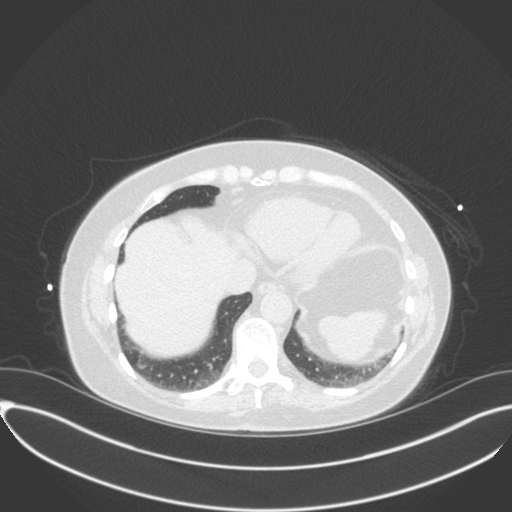
[im 20/69  vessel]
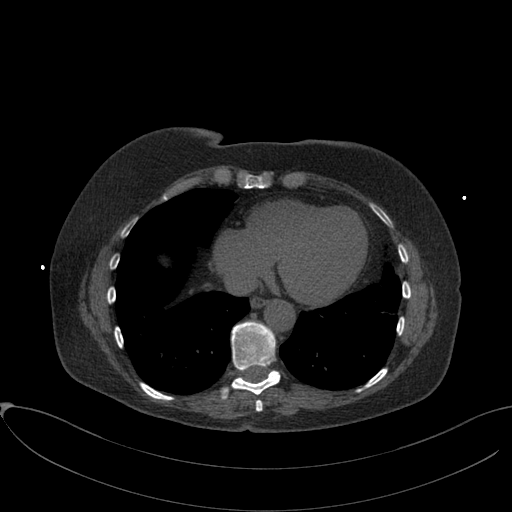
[im 30/69  vessel]
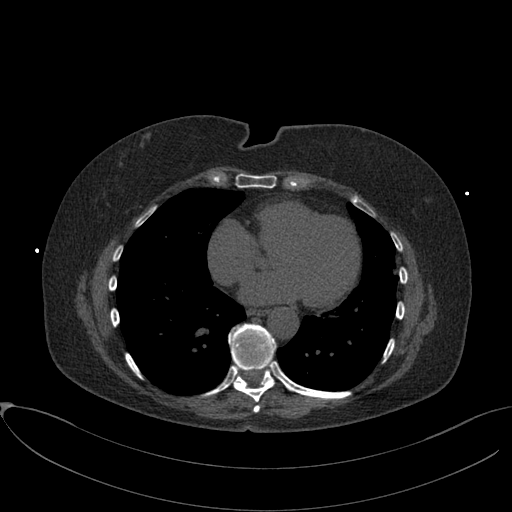
[im 39/69  vessel]
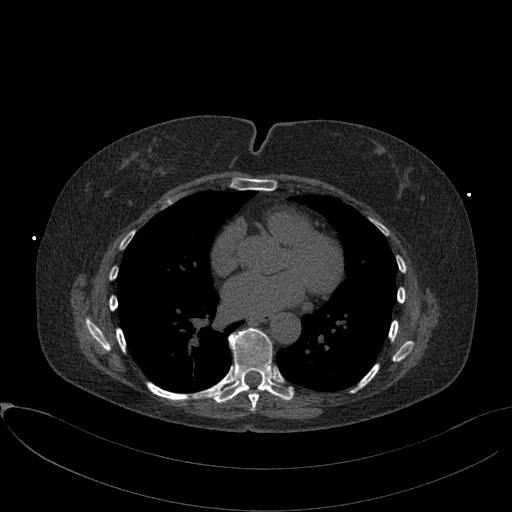
[im 49/69  vessel]
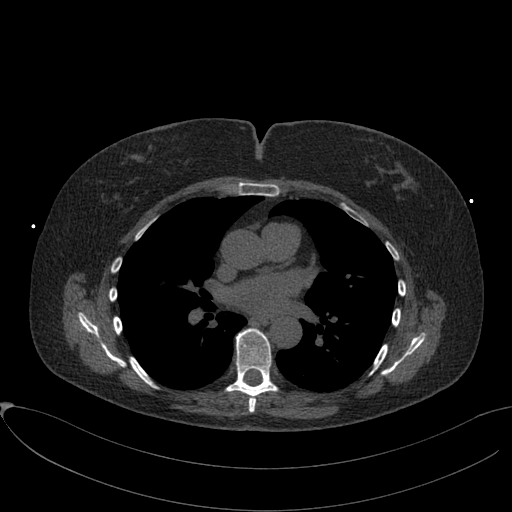
[im 49/69  lung]
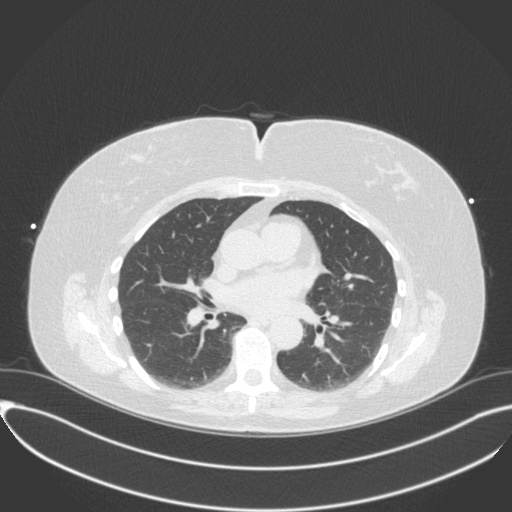
[im 59/69  vessel]
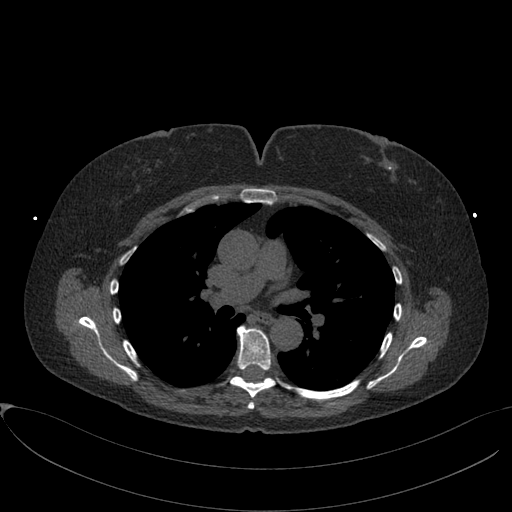

[14 of 20 positions shown; findings below may reference images not displayed]

FINDINGS: Vascular: There are no significant vascular findings.

Mediastinum/Nodes: There are no enlarged lymph nodes.The visualized
esophagus demonstrates no significant findings.

Lungs/Pleura: Mild linear scarring in the lingula and anterior left
lower lobe. No focal consolidation, pleural effusion, or
pneumothorax.

Upper abdomen: No acute abnormality.

Musculoskeletal/Chest wall: No chest wall abnormality. No acute or
significant osseous findings.
IMPRESSION: 1. No significant extracardiac findings.
FINDINGS: Coronary arteries: Normal origins.

Coronary Calcium Score:

Left main: 0

Left anterior descending artery: 0

Left circumflex artery: 0

Right coronary artery: 0

Total: 0

Percentile: 0

Pericardium: Normal.

Aorta: Normal caliber of ascending aorta. No aortic atherosclerosis
noted.

Non-cardiac: See separate report from [REDACTED].
IMPRESSION: Coronary calcium score of 0. This was 0 percentile for age-, race-,
and sex-matched controls.



If CAC=0, it is reasonable to withhold statin therapy and reassess
in 5 to 10 years, as long as higher risk conditions are absent
(diabetes mellitus, family history of premature CHD in first degree
relatives (males <55 years; females <65 years), cigarette smoking,
or LDL >=190 mg/dL).

If CAC is 1 to 99, it is reasonable to initiate statin therapy for
patients >=55 years of age.

If CAC is >=100 or >=75th percentile, it is reasonable to initiate
statin therapy at any age.

Cardiology referral should be considered for patients with CAC
scores >=400 or >=75th percentile.

*2558 AHA/ACC/AACVPR/AAPA/ABC/ABUTALEB/DIDINE/LUKEZ/Aksam/ALHUAY/TRACYE/TOC
Guideline on the Management of Blood Cholesterol: A Report of the
American College of Cardiology/American Heart Association Task Force
on Clinical Practice Guidelines. J Am Coll Cardiol.
0666;73(24):7421-7330.

*** End of Addendum ***
FINDINGS: Vascular: There are no significant vascular findings.

Mediastinum/Nodes: There are no enlarged lymph nodes.The visualized
esophagus demonstrates no significant findings.

Lungs/Pleura: Mild linear scarring in the lingula and anterior left
lower lobe. No focal consolidation, pleural effusion, or
pneumothorax.

Upper abdomen: No acute abnormality.

Musculoskeletal/Chest wall: No chest wall abnormality. No acute or
significant osseous findings.
IMPRESSION: 1. No significant extracardiac findings.

## 2023-09-06 DIAGNOSIS — K760 Fatty (change of) liver, not elsewhere classified: Secondary | ICD-10-CM | POA: Diagnosis not present

## 2023-09-06 DIAGNOSIS — D49511 Neoplasm of unspecified behavior of right kidney: Secondary | ICD-10-CM | POA: Diagnosis not present

## 2023-09-06 DIAGNOSIS — C641 Malignant neoplasm of right kidney, except renal pelvis: Secondary | ICD-10-CM | POA: Diagnosis not present

## 2023-09-06 DIAGNOSIS — Z905 Acquired absence of kidney: Secondary | ICD-10-CM | POA: Diagnosis not present

## 2023-09-06 DIAGNOSIS — Z9049 Acquired absence of other specified parts of digestive tract: Secondary | ICD-10-CM | POA: Diagnosis not present

## 2023-09-14 DIAGNOSIS — K080 Exfoliation of teeth due to systemic causes: Secondary | ICD-10-CM | POA: Diagnosis not present

## 2023-09-21 DIAGNOSIS — N3941 Urge incontinence: Secondary | ICD-10-CM | POA: Diagnosis not present

## 2023-09-21 DIAGNOSIS — D49511 Neoplasm of unspecified behavior of right kidney: Secondary | ICD-10-CM | POA: Diagnosis not present

## 2023-09-28 DIAGNOSIS — K080 Exfoliation of teeth due to systemic causes: Secondary | ICD-10-CM | POA: Diagnosis not present

## 2023-11-04 DIAGNOSIS — N3946 Mixed incontinence: Secondary | ICD-10-CM | POA: Diagnosis not present

## 2023-11-04 DIAGNOSIS — R8271 Bacteriuria: Secondary | ICD-10-CM | POA: Diagnosis not present

## 2023-11-08 DIAGNOSIS — R200 Anesthesia of skin: Secondary | ICD-10-CM | POA: Diagnosis not present

## 2023-11-08 DIAGNOSIS — M19132 Post-traumatic osteoarthritis, left wrist: Secondary | ICD-10-CM | POA: Diagnosis not present

## 2023-11-08 DIAGNOSIS — R202 Paresthesia of skin: Secondary | ICD-10-CM | POA: Diagnosis not present

## 2023-12-07 ENCOUNTER — Telehealth: Payer: Self-pay | Admitting: *Deleted

## 2023-12-07 NOTE — Telephone Encounter (Addendum)
 Pt called to cancel appt for 10/30. She states that she had CT from [facility] back in September so no other scans are needed. This nurse will call [facility] to request results

## 2023-12-09 ENCOUNTER — Inpatient Hospital Stay

## 2023-12-15 ENCOUNTER — Inpatient Hospital Stay: Admitting: Internal Medicine

## 2023-12-28 DIAGNOSIS — N3020 Other chronic cystitis without hematuria: Secondary | ICD-10-CM | POA: Diagnosis not present

## 2023-12-28 DIAGNOSIS — R3915 Urgency of urination: Secondary | ICD-10-CM | POA: Diagnosis not present

## 2023-12-28 DIAGNOSIS — R350 Frequency of micturition: Secondary | ICD-10-CM | POA: Diagnosis not present

## 2023-12-30 ENCOUNTER — Encounter: Payer: Self-pay | Admitting: Urology

## 2024-01-14 DIAGNOSIS — Z1272 Encounter for screening for malignant neoplasm of vagina: Secondary | ICD-10-CM | POA: Diagnosis not present

## 2024-01-14 DIAGNOSIS — Z8049 Family history of malignant neoplasm of other genital organs: Secondary | ICD-10-CM | POA: Diagnosis not present
